# Patient Record
Sex: Female | Born: 1955 | Race: White | Hispanic: No | Marital: Married | Smoking: Never smoker
Health system: Southern US, Community
[De-identification: ages and names within clinical notes are randomized; demographics above are authoritative.]

## PROBLEM LIST (undated history)

## (undated) DIAGNOSIS — E7800 Pure hypercholesterolemia, unspecified: Secondary | ICD-10-CM

## (undated) DIAGNOSIS — E559 Vitamin D deficiency, unspecified: Secondary | ICD-10-CM

## (undated) DIAGNOSIS — E785 Hyperlipidemia, unspecified: Secondary | ICD-10-CM

## (undated) DIAGNOSIS — I10 Essential (primary) hypertension: Secondary | ICD-10-CM

## (undated) DIAGNOSIS — G4700 Insomnia, unspecified: Secondary | ICD-10-CM

## (undated) DIAGNOSIS — E119 Type 2 diabetes mellitus without complications: Secondary | ICD-10-CM

## (undated) DIAGNOSIS — Z87891 Personal history of nicotine dependence: Secondary | ICD-10-CM

## (undated) HISTORY — DX: Pure hypercholesterolemia, unspecified: E78.00

## (undated) HISTORY — DX: Insomnia, unspecified: G47.00

## (undated) HISTORY — DX: Vitamin D deficiency, unspecified: E55.9

## (undated) HISTORY — DX: Personal history of nicotine dependence: Z87.891

## (undated) SURGERY — Surgical Case
Anesthesia: *Unknown

---

## 2016-12-05 ENCOUNTER — Emergency Department (HOSPITAL_COMMUNITY): Payer: Worker's Compensation | Admitting: Anesthesiology

## 2016-12-05 ENCOUNTER — Inpatient Hospital Stay (HOSPITAL_COMMUNITY)
Admission: EM | Admit: 2016-12-05 | Discharge: 2016-12-07 | DRG: 482 | Disposition: A | Discharge status: Home Health | Payer: Worker's Compensation | Attending: Orthopaedic Surgery | Admitting: Orthopaedic Surgery

## 2016-12-05 ENCOUNTER — Encounter (HOSPITAL_COMMUNITY): Payer: Self-pay | Admitting: Emergency Medicine

## 2016-12-05 ENCOUNTER — Emergency Department (HOSPITAL_COMMUNITY): Payer: Worker's Compensation

## 2016-12-05 ENCOUNTER — Encounter (HOSPITAL_COMMUNITY)
Admission: EM | Disposition: A | Discharge status: Home Health | Payer: Self-pay | Source: Home / Self Care | Attending: Orthopaedic Surgery

## 2016-12-05 ENCOUNTER — Inpatient Hospital Stay (HOSPITAL_COMMUNITY): Payer: Worker's Compensation

## 2016-12-05 DIAGNOSIS — I10 Essential (primary) hypertension: Secondary | ICD-10-CM | POA: Diagnosis present

## 2016-12-05 DIAGNOSIS — E119 Type 2 diabetes mellitus without complications: Secondary | ICD-10-CM | POA: Diagnosis present

## 2016-12-05 DIAGNOSIS — S72141A Displaced intertrochanteric fracture of right femur, initial encounter for closed fracture: Principal | ICD-10-CM

## 2016-12-05 DIAGNOSIS — S72001A Fracture of unspecified part of neck of right femur, initial encounter for closed fracture: Secondary | ICD-10-CM | POA: Diagnosis not present

## 2016-12-05 DIAGNOSIS — T148XXA Other injury of unspecified body region, initial encounter: Secondary | ICD-10-CM

## 2016-12-05 DIAGNOSIS — E785 Hyperlipidemia, unspecified: Secondary | ICD-10-CM | POA: Diagnosis present

## 2016-12-05 DIAGNOSIS — W010XXA Fall on same level from slipping, tripping and stumbling without subsequent striking against object, initial encounter: Secondary | ICD-10-CM | POA: Diagnosis present

## 2016-12-05 DIAGNOSIS — M25551 Pain in right hip: Secondary | ICD-10-CM | POA: Diagnosis present

## 2016-12-05 HISTORY — DX: Essential (primary) hypertension: I10

## 2016-12-05 HISTORY — DX: Type 2 diabetes mellitus without complications: E11.9

## 2016-12-05 HISTORY — DX: Hyperlipidemia, unspecified: E78.5

## 2016-12-05 HISTORY — PX: INTRAMEDULLARY (IM) NAIL INTERTROCHANTERIC: SHX5875

## 2016-12-05 LAB — BASIC METABOLIC PANEL
ANION GAP: 12 (ref 5–15)
BUN: 14 mg/dL (ref 6–20)
CO2: 19 mmol/L — AB (ref 22–32)
Calcium: 8.6 mg/dL — ABNORMAL LOW (ref 8.9–10.3)
Chloride: 104 mmol/L (ref 101–111)
Creatinine, Ser: 0.83 mg/dL (ref 0.44–1.00)
GFR calc Af Amer: >60 mL/min (ref >60)
GFR calc non Af Amer: >60 mL/min (ref >60)
Glucose, Bld: 210 mg/dL — ABNORMAL HIGH (ref 65–99)
POTASSIUM: 3.9 mmol/L (ref 3.5–5.1)
Sodium: 135 mmol/L (ref 135–145)

## 2016-12-05 LAB — CBC
HCT: 36.5 % (ref 36.0–46.0)
Hemoglobin: 12.7 g/dL (ref 12.0–15.0)
MCH: 32.3 pg (ref 26.0–34.0)
MCHC: 34.8 g/dL (ref 30.0–36.0)
MCV: 92.9 fL (ref 78.0–100.0)
PLATELETS: 164 10*3/uL (ref 150–400)
RBC: 3.93 MIL/uL (ref 3.87–5.11)
RDW: 12.3 % (ref 11.5–15.5)
WBC: 9 10*3/uL (ref 4.0–10.5)

## 2016-12-05 LAB — GLUCOSE, CAPILLARY
GLUCOSE-CAPILLARY: 154 mg/dL — AB (ref 65–99)
Glucose-Capillary: 179 mg/dL — ABNORMAL HIGH (ref 65–99)

## 2016-12-05 LAB — CBC WITH DIFFERENTIAL/PLATELET
BASOS PCT: 0 %
Basophils Absolute: 0 10*3/uL (ref 0.0–0.1)
Eosinophils Absolute: 0 10*3/uL (ref 0.0–0.7)
Eosinophils Relative: 0 %
HEMATOCRIT: 41.6 % (ref 36.0–46.0)
Hemoglobin: 14.8 g/dL (ref 12.0–15.0)
LYMPHS ABS: 0.9 10*3/uL (ref 0.7–4.0)
LYMPHS PCT: 8 %
MCH: 33.5 pg (ref 26.0–34.0)
MCHC: 35.6 g/dL (ref 30.0–36.0)
MCV: 94.1 fL (ref 78.0–100.0)
MONOS PCT: 4 %
Monocytes Absolute: 0.5 10*3/uL (ref 0.1–1.0)
NEUTROS ABS: 10.3 10*3/uL — AB (ref 1.7–7.7)
NEUTROS PCT: 88 %
Platelets: 142 10*3/uL — ABNORMAL LOW (ref 150–400)
RBC: 4.42 MIL/uL (ref 3.87–5.11)
RDW: 12.4 % (ref 11.5–15.5)
WBC: 11.8 10*3/uL — ABNORMAL HIGH (ref 4.0–10.5)

## 2016-12-05 SURGERY — FIXATION, FRACTURE, INTERTROCHANTERIC, WITH INTRAMEDULLARY ROD
Anesthesia: General | Laterality: Right

## 2016-12-05 MED ORDER — METHOCARBAMOL 500 MG PO TABS
ORAL_TABLET | ORAL | Status: AC
  Filled 2016-12-05: qty 1

## 2016-12-05 MED ORDER — ACETAMINOPHEN 650 MG RE SUPP: 650.0000 mg | Freq: Four times a day (QID) | RECTAL | Status: DC | PRN

## 2016-12-05 MED ORDER — FENTANYL CITRATE (PF) 100 MCG/2ML IJ SOLN
INTRAMUSCULAR | Status: DC | PRN
  Administered 2016-12-05 (×2): 50 ug via INTRAVENOUS

## 2016-12-05 MED ORDER — METOCLOPRAMIDE HCL 5 MG PO TABS: 5.0000 mg | ORAL_TABLET | Freq: Three times a day (TID) | ORAL | Status: DC | PRN

## 2016-12-05 MED ORDER — BUPIVACAINE IN DEXTROSE 0.75-8.25 % IT SOLN
INTRATHECAL | Status: DC | PRN
  Administered 2016-12-05: 2 mL via INTRATHECAL

## 2016-12-05 MED ORDER — MIDAZOLAM HCL 5 MG/5ML IJ SOLN
INTRAMUSCULAR | Status: DC | PRN
  Administered 2016-12-05: 2 mg via INTRAVENOUS

## 2016-12-05 MED ORDER — HYDROMORPHONE HCL 1 MG/ML IJ SOLN: 0.2500 mg | INTRAMUSCULAR | Status: DC | PRN

## 2016-12-05 MED ORDER — POTASSIUM CHLORIDE IN NACL 20-0.45 MEQ/L-% IV SOLN: INTRAVENOUS | Status: DC

## 2016-12-05 MED ORDER — METHOCARBAMOL 1000 MG/10ML IJ SOLN: 500.0000 mg | Freq: Four times a day (QID) | INTRAVENOUS | Status: DC | PRN

## 2016-12-05 MED ORDER — PROPOFOL 500 MG/50ML IV EMUL
INTRAVENOUS | Status: DC | PRN
  Administered 2016-12-05: 50 ug/kg/min via INTRAVENOUS
  Administered 2016-12-05: 75 ug/kg/min via INTRAVENOUS

## 2016-12-05 MED ORDER — CEFAZOLIN IN D5W 1 GM/50ML IV SOLN
1.0000 g | Freq: Four times a day (QID) | INTRAVENOUS | Status: AC
  Administered 2016-12-05 – 2016-12-06 (×3): 1 g via INTRAVENOUS
  Filled 2016-12-05 (×3): qty 50

## 2016-12-05 MED ORDER — CHLORHEXIDINE GLUCONATE 4 % EX LIQD: 60.0000 mL | Freq: Once | CUTANEOUS | Status: DC

## 2016-12-05 MED ORDER — LACTATED RINGERS IV SOLN
INTRAVENOUS | Status: DC
  Administered 2016-12-05 (×2): via INTRAVENOUS

## 2016-12-05 MED ORDER — SODIUM CHLORIDE 0.9 % IV BOLUS (SEPSIS)
500.0000 mL | Freq: Once | INTRAVENOUS | Status: AC
  Administered 2016-12-05: 500 mL via INTRAVENOUS

## 2016-12-05 MED ORDER — PHENYLEPHRINE HCL 10 MG/ML IJ SOLN
INTRAMUSCULAR | Status: DC | PRN
  Administered 2016-12-05 (×5): 80 ug via INTRAVENOUS
  Administered 2016-12-05 (×2): 120 ug via INTRAVENOUS
  Administered 2016-12-05 (×2): 80 ug via INTRAVENOUS
  Administered 2016-12-05: 40 ug via INTRAVENOUS

## 2016-12-05 MED ORDER — METHOCARBAMOL 500 MG PO TABS
500.0000 mg | ORAL_TABLET | Freq: Four times a day (QID) | ORAL | Status: DC | PRN
  Administered 2016-12-05 – 2016-12-07 (×2): 500 mg via ORAL
  Filled 2016-12-05: qty 1

## 2016-12-05 MED ORDER — PROPOFOL 10 MG/ML IV BOLUS
INTRAVENOUS | Status: DC | PRN
  Administered 2016-12-05: 20 mg via INTRAVENOUS
  Administered 2016-12-05: 10 mg via INTRAVENOUS
  Administered 2016-12-05: 20 mg via INTRAVENOUS

## 2016-12-05 MED ORDER — HYDROMORPHONE HCL 2 MG/ML IJ SOLN: 1.0000 mg | INTRAMUSCULAR | Status: DC | PRN

## 2016-12-05 MED ORDER — SODIUM CHLORIDE 0.9 % IV SOLN
INTRAVENOUS | Status: DC
  Administered 2016-12-06: 06:00:00 via INTRAVENOUS

## 2016-12-05 MED ORDER — VITAMIN D 1000 UNITS PO TABS
5000.0000 [IU] | ORAL_TABLET | Freq: Every day | ORAL | Status: DC
  Administered 2016-12-06 – 2016-12-07 (×2): 5000 [IU] via ORAL
  Filled 2016-12-05 (×2): qty 5

## 2016-12-05 MED ORDER — HYDROMORPHONE HCL 1 MG/ML IJ SOLN
1.0000 mg | INTRAMUSCULAR | Status: DC | PRN
Start: 2016-12-05 — End: 2016-12-05

## 2016-12-05 MED ORDER — LOSARTAN POTASSIUM 25 MG PO TABS
25.0000 mg | ORAL_TABLET | Freq: Every day | ORAL | Status: DC
  Administered 2016-12-06 – 2016-12-07 (×2): 25 mg via ORAL
  Filled 2016-12-05 (×2): qty 1

## 2016-12-05 MED ORDER — DOCUSATE SODIUM 100 MG PO CAPS
100.0000 mg | ORAL_CAPSULE | Freq: Two times a day (BID) | ORAL | Status: DC
  Administered 2016-12-05 – 2016-12-07 (×4): 100 mg via ORAL
  Filled 2016-12-05 (×4): qty 1

## 2016-12-05 MED ORDER — ACETAMINOPHEN 325 MG PO TABS: 650.0000 mg | ORAL_TABLET | Freq: Four times a day (QID) | ORAL | Status: DC | PRN

## 2016-12-05 MED ORDER — OXYCODONE HCL 5 MG PO TABS
ORAL_TABLET | ORAL | Status: AC
  Administered 2016-12-05: 10 mg via ORAL
  Filled 2016-12-05: qty 2

## 2016-12-05 MED ORDER — ONDANSETRON HCL 4 MG PO TABS: 4.0000 mg | ORAL_TABLET | Freq: Four times a day (QID) | ORAL | Status: DC | PRN

## 2016-12-05 MED ORDER — HYDROCODONE-ACETAMINOPHEN 10-325 MG PO TABS
0.5000 | ORAL_TABLET | ORAL | Status: DC | PRN
Start: 2016-12-05 — End: 2016-12-07
  Administered 2016-12-05 – 2016-12-07 (×3): 2 via ORAL
  Filled 2016-12-05 (×3): qty 2

## 2016-12-05 MED ORDER — DIPHENHYDRAMINE HCL 12.5 MG/5ML PO ELIX: 12.5000 mg | ORAL_SOLUTION | ORAL | Status: DC | PRN

## 2016-12-05 MED ORDER — METOCLOPRAMIDE HCL 5 MG/ML IJ SOLN
5.0000 mg | Freq: Three times a day (TID) | INTRAMUSCULAR | Status: DC | PRN
Start: 2016-12-05 — End: 2016-12-07

## 2016-12-05 MED ORDER — LIDOCAINE HCL (CARDIAC) 20 MG/ML IV SOLN
INTRAVENOUS | Status: DC | PRN
  Administered 2016-12-05: 40 mg via INTRAVENOUS

## 2016-12-05 MED ORDER — PHENYLEPHRINE 40 MCG/ML (10ML) SYRINGE FOR IV PUSH (FOR BLOOD PRESSURE SUPPORT)
PREFILLED_SYRINGE | INTRAVENOUS | Status: AC
  Filled 2016-12-05: qty 20

## 2016-12-05 MED ORDER — METFORMIN HCL ER 500 MG PO TB24
1000.0000 mg | ORAL_TABLET | Freq: Two times a day (BID) | ORAL | Status: DC
  Administered 2016-12-06 – 2016-12-07 (×3): 1000 mg via ORAL
  Filled 2016-12-05 (×3): qty 2

## 2016-12-05 MED ORDER — HYDROMORPHONE HCL 1 MG/ML IJ SOLN
INTRAMUSCULAR | Status: AC
Start: 2016-12-05 — End: 2016-12-05
  Administered 2016-12-05: 0.5 mg via INTRAVENOUS
  Filled 2016-12-05: qty 0.5

## 2016-12-05 MED ORDER — PROMETHAZINE HCL 25 MG/ML IJ SOLN: 6.2500 mg | INTRAMUSCULAR | Status: DC | PRN

## 2016-12-05 MED ORDER — CEFAZOLIN SODIUM-DEXTROSE 2-4 GM/100ML-% IV SOLN
INTRAVENOUS | Status: AC
  Filled 2016-12-05: qty 100

## 2016-12-05 MED ORDER — OXYCODONE HCL 5 MG PO TABS
5.0000 mg | ORAL_TABLET | ORAL | Status: DC | PRN
  Administered 2016-12-05 – 2016-12-06 (×3): 10 mg via ORAL
  Administered 2016-12-07: 5 mg via ORAL
  Administered 2016-12-07: 10 mg via ORAL
  Filled 2016-12-05: qty 2
  Filled 2016-12-05: qty 1
  Filled 2016-12-05 (×2): qty 2

## 2016-12-05 MED ORDER — HYDROMORPHONE HCL 1 MG/ML IJ SOLN
0.2500 mg | INTRAMUSCULAR | Status: DC | PRN
  Administered 2016-12-05: 0.5 mg via INTRAVENOUS

## 2016-12-05 MED ORDER — ONDANSETRON HCL 4 MG/2ML IJ SOLN
4.0000 mg | Freq: Once | INTRAMUSCULAR | Status: AC
  Administered 2016-12-05: 4 mg via INTRAVENOUS
  Filled 2016-12-05: qty 2

## 2016-12-05 MED ORDER — ZOLPIDEM TARTRATE 5 MG PO TABS
5.0000 mg | ORAL_TABLET | Freq: Every evening | ORAL | Status: DC | PRN
  Administered 2016-12-06: 5 mg via ORAL
  Filled 2016-12-05: qty 1

## 2016-12-05 MED ORDER — ASPIRIN 325 MG PO TABS
325.0000 mg | ORAL_TABLET | Freq: Every day | ORAL | Status: DC
  Administered 2016-12-06 – 2016-12-07 (×2): 325 mg via ORAL
  Filled 2016-12-05 (×2): qty 1

## 2016-12-05 MED ORDER — LIDOCAINE 2% (20 MG/ML) 5 ML SYRINGE
INTRAMUSCULAR | Status: AC
  Filled 2016-12-05: qty 5

## 2016-12-05 MED ORDER — MIDAZOLAM HCL 2 MG/2ML IJ SOLN
INTRAMUSCULAR | Status: AC
  Filled 2016-12-05: qty 2

## 2016-12-05 MED ORDER — DEXTROSE 5 % IV SOLN: 500.0000 mg | Freq: Four times a day (QID) | INTRAVENOUS | Status: DC | PRN

## 2016-12-05 MED ORDER — PROPOFOL 10 MG/ML IV BOLUS
INTRAVENOUS | Status: AC
  Filled 2016-12-05: qty 20

## 2016-12-05 MED ORDER — HYDROMORPHONE HCL 1 MG/ML IJ SOLN
1.0000 mg | Freq: Once | INTRAMUSCULAR | Status: AC
  Administered 2016-12-05: 1 mg via INTRAVENOUS
  Filled 2016-12-05: qty 1

## 2016-12-05 MED ORDER — ENOXAPARIN SODIUM 40 MG/0.4ML ~~LOC~~ SOLN
40.0000 mg | Freq: Every day | SUBCUTANEOUS | Status: DC
  Administered 2016-12-06: 40 mg via SUBCUTANEOUS
  Filled 2016-12-05: qty 0.4

## 2016-12-05 MED ORDER — ONDANSETRON HCL 4 MG/2ML IJ SOLN: 4.0000 mg | Freq: Four times a day (QID) | INTRAMUSCULAR | Status: DC | PRN

## 2016-12-05 MED ORDER — METHOCARBAMOL 500 MG PO TABS: 500.0000 mg | ORAL_TABLET | Freq: Four times a day (QID) | ORAL | Status: DC | PRN

## 2016-12-05 MED ORDER — CEFAZOLIN SODIUM-DEXTROSE 2-4 GM/100ML-% IV SOLN
2.0000 g | INTRAVENOUS | Status: AC
  Administered 2016-12-05: 2 g via INTRAVENOUS

## 2016-12-05 MED ORDER — HYDROMORPHONE HCL 2 MG/ML IJ SOLN
1.0000 mg | INTRAMUSCULAR | Status: DC | PRN
  Administered 2016-12-06 (×6): 1 mg via INTRAVENOUS
  Filled 2016-12-05 (×6): qty 1

## 2016-12-05 MED ORDER — 0.9 % SODIUM CHLORIDE (POUR BTL) OPTIME
TOPICAL | Status: DC | PRN
  Administered 2016-12-05: 1000 mL

## 2016-12-05 MED ORDER — DOCUSATE SODIUM 100 MG PO CAPS: 100.0000 mg | ORAL_CAPSULE | Freq: Two times a day (BID) | ORAL | Status: DC

## 2016-12-05 MED ORDER — HYDROCHLOROTHIAZIDE 25 MG PO TABS
25.0000 mg | ORAL_TABLET | Freq: Every day | ORAL | Status: DC
  Administered 2016-12-07: 25 mg via ORAL
  Filled 2016-12-05 (×2): qty 1

## 2016-12-05 MED ORDER — FENTANYL CITRATE (PF) 100 MCG/2ML IJ SOLN
INTRAMUSCULAR | Status: AC
  Filled 2016-12-05: qty 2

## 2016-12-05 MED ORDER — PRAVASTATIN SODIUM 20 MG PO TABS
20.0000 mg | ORAL_TABLET | Freq: Every day | ORAL | Status: DC
Start: 2016-12-05 — End: 2016-12-07
  Administered 2016-12-05 – 2016-12-06 (×2): 20 mg via ORAL
  Filled 2016-12-05 (×2): qty 1

## 2016-12-05 SURGICAL SUPPLY — 46 items
BIT DRILL CANN LRG QC 17.0X300 (BIT) ×3 IMPLANT
BLADE SURG 15 STRL LF DISP TIS (BLADE) ×1 IMPLANT
BLADE SURG 15 STRL SS (BLADE) ×2
BNDG GAUZE ELAST 4 BULKY (GAUZE/BANDAGES/DRESSINGS) ×3 IMPLANT
CATH FOLEY 2WAY SLVR 30CC 16FR (CATHETERS) ×3 IMPLANT
COVER PERINEAL POST (MISCELLANEOUS) ×3 IMPLANT
COVER SURGICAL LIGHT HANDLE (MISCELLANEOUS) ×3 IMPLANT
DRAPE STERI IOBAN 125X83 (DRAPES) ×3 IMPLANT
DRSG AQUACEL AG ADV 3.5X10 (GAUZE/BANDAGES/DRESSINGS) ×3 IMPLANT
DRSG MEPILEX BORDER 4X4 (GAUZE/BANDAGES/DRESSINGS) IMPLANT
DRSG MEPILEX BORDER 4X8 (GAUZE/BANDAGES/DRESSINGS) ×3 IMPLANT
DRSG PAD ABDOMINAL 8X10 ST (GAUZE/BANDAGES/DRESSINGS) ×6 IMPLANT
DURAPREP 26ML APPLICATOR (WOUND CARE) ×3 IMPLANT
ELECT REM PT RETURN 9FT ADLT (ELECTROSURGICAL) ×3
ELECTRODE REM PT RTRN 9FT ADLT (ELECTROSURGICAL) ×1 IMPLANT
FACESHIELD WRAPAROUND (MASK) ×3 IMPLANT
GAUZE XEROFORM 1X8 LF (GAUZE/BANDAGES/DRESSINGS) ×3 IMPLANT
GAUZE XEROFORM 5X9 LF (GAUZE/BANDAGES/DRESSINGS) ×3 IMPLANT
GLOVE BIO SURGEON STRL SZ8 (GLOVE) ×3 IMPLANT
GLOVE BIOGEL PI IND STRL 8 (GLOVE) ×1 IMPLANT
GLOVE BIOGEL PI INDICATOR 8 (GLOVE) ×2
GLOVE ORTHO TXT STRL SZ7.5 (GLOVE) ×3 IMPLANT
GOWN STRL REUS W/ TWL LRG LVL3 (GOWN DISPOSABLE) ×1 IMPLANT
GOWN STRL REUS W/ TWL XL LVL3 (GOWN DISPOSABLE) ×2 IMPLANT
GOWN STRL REUS W/TWL LRG LVL3 (GOWN DISPOSABLE) ×2
GOWN STRL REUS W/TWL XL LVL3 (GOWN DISPOSABLE) ×4
GUIDEWIRE 3.2X400 (WIRE) ×3 IMPLANT
KIT BASIN OR (CUSTOM PROCEDURE TRAY) ×3 IMPLANT
KIT ROOM TURNOVER OR (KITS) ×3 IMPLANT
LINER BOOT UNIVERSAL DISP (MISCELLANEOUS) ×3 IMPLANT
MANIFOLD NEPTUNE II (INSTRUMENTS) ×3 IMPLANT
NAIL 9MM/130 TI CANN TFNA 340 (Nail) ×3 IMPLANT
NS IRRIG 1000ML POUR BTL (IV SOLUTION) ×3 IMPLANT
PACK GENERAL/GYN (CUSTOM PROCEDURE TRAY) ×3 IMPLANT
PAD ARMBOARD 7.5X6 YLW CONV (MISCELLANEOUS) ×3 IMPLANT
PAD CAST 4YDX4 CTTN HI CHSV (CAST SUPPLIES) ×2 IMPLANT
PADDING CAST COTTON 4X4 STRL (CAST SUPPLIES) ×4
SCREW TFNA 90MM HIP (Orthopedic Implant) ×3 IMPLANT
STAPLER VISISTAT 35W (STAPLE) ×3 IMPLANT
SUT VIC AB 0 CT1 27 (SUTURE) ×4
SUT VIC AB 0 CT1 27XBRD ANBCTR (SUTURE) ×2 IMPLANT
SUT VIC AB 2-0 CT1 27 (SUTURE) ×4
SUT VIC AB 2-0 CT1 TAPERPNT 27 (SUTURE) ×2 IMPLANT
TOWEL OR 17X24 6PK STRL BLUE (TOWEL DISPOSABLE) ×3 IMPLANT
TOWEL OR 17X26 10 PK STRL BLUE (TOWEL DISPOSABLE) ×3 IMPLANT
WATER STERILE IRR 1000ML POUR (IV SOLUTION) IMPLANT

## 2016-12-05 NOTE — Anesthesia Procedure Notes (Signed)
Spinal  Patient location during procedure: OR Start time: 12/05/2016 4:53 PM End time: 12/05/2016 4:56 PM Staffing Anesthesiologist: Cecile HearingURK, Tenley Winward EDWARD Performed: anesthesiologist  Preanesthetic Checklist Completed: patient identified, surgical consent, pre-op evaluation, timeout performed, IV checked, risks and benefits discussed and monitors and equipment checked Spinal Block Patient position: right lateral decubitus Prep: site prepped and draped and DuraPrep Patient monitoring: continuous pulse ox and blood pressure Approach: midline Location: L3-4 Needle Needle type: Pencan  Needle gauge: 24 G Needle length: 12.7 cm Assessment Sensory level: T10 Additional Notes Functioning IV was confirmed and monitors were applied. Sterile prep and drape, including hand hygiene, mask and sterile gloves were used. The patient was positioned and the spine was prepped. The skin was anesthetized with lidocaine.  Free flow of clear CSF was obtained prior to injecting local anesthetic into the CSF.  The spinal needle aspirated freely following injection.  The needle was carefully withdrawn.  The patient tolerated the procedure well. Consent was obtained prior to procedure with all questions answered and concerns addressed. Risks including but not limited to bleeding, infection, nerve damage, paralysis, failed block, inadequate analgesia, allergic reaction, high spinal, itching and headache were discussed and the patient wished to proceed.   Arrie AranStephen Levern Kalka, MD

## 2016-12-05 NOTE — Addendum Note (Signed)
Addendum  created 12/05/16 2058 by Dorris Singhharlene Hermes Wafer, MD   Order sets accessed

## 2016-12-05 NOTE — Anesthesia Procedure Notes (Signed)
Spinal

## 2016-12-05 NOTE — ED Notes (Signed)
Patient returned from xray.

## 2016-12-05 NOTE — ED Notes (Signed)
Patient laying in prone position for comfort given pain medication prior to moving patient. Patient tolerated moving and removing jeans without incident. Pedal pulse +2 full sensation after.

## 2016-12-05 NOTE — ED Notes (Signed)
Ortho provider at  bedside ?

## 2016-12-05 NOTE — Brief Op Note (Signed)
12/05/2016  6:05 PM  PATIENT:  Carol Lutz  61 y.o. female  PRE-OPERATIVE DIAGNOSIS:  RIGHT HIP FRACTURE  POST-OPERATIVE DIAGNOSIS:  RIGHT HIP FRACTURE  PROCEDURE:  Procedure(s): ORIF RIGHT HIP FACTURE (Right)  SURGEON:  Surgeon(s) and Role:    * Kathryne Hitchhristopher Y Blackman, MD - Primary  PHYSICIAN ASSISTANT: Rexene EdisonGil Clark, PA-C  ANESTHESIA:   spinal  EBL:  Total I/O In: -  Out: 500 [Urine:350; Blood:150]  COUNTS:  YES   DICTATION: .Other Dictation: Dictation Number [id number]  PLAN OF CARE: Admit to inpatient   PATIENT DISPOSITION:  PACU - hemodynamically stable.   Delay start of Pharmacological VTE agent (>24hrs) due to surgical blood loss or risk of bleeding: no

## 2016-12-05 NOTE — Transfer of Care (Signed)
Immediate Anesthesia Transfer of Care Note  Patient: Carol Lutz  Procedure(s) Performed: Procedure(s): ORIF RIGHT HIP FACTURE (Right)  Patient Location: PACU  Anesthesia Type:Spinal  Level of Consciousness: awake, alert , oriented and patient cooperative  Airway & Oxygen Therapy: Patient Spontanous Breathing and Patient connected to nasal cannula oxygen  Post-op Assessment: Report given to RN and Post -op Vital signs reviewed and stable  Post vital signs: Reviewed and stable  Last Vitals:  Vitals:   12/05/16 1515 12/05/16 1530  BP: 129/77 133/85  Pulse: 93 87  Resp:  16  Temp:      Last Pain:  Vitals:   12/05/16 1255  TempSrc:   PainSc: 2          Complications: No apparent anesthesia complications

## 2016-12-05 NOTE — ED Provider Notes (Signed)
[facility]-EMERGENCY DEPT Provider Note   CSN: [id number] Arrival date & time: 12/05/16  1131     History   Chief Complaint Chief Complaint  Patient presents with  . Fall  . Hip Pain    HPI Carol Lutz is a 61 y.o. female.  Patient was getting something out of her car, heavy pill, when she swung it around and this movement and caused her to fall forward injuring her "pelvis".  She was unable to get up after the fall.  She complains of pain only in her pelvis.  She presents by EMS, prone, for evaluation.  She was treated with fentanyl with slight improvement of her discomfort.  She denies other preceding symptoms or recent illnesses.  There are no other known modifying factors.  HPI  Past Medical History:  Diagnosis Date  . Diabetes mellitus without complication (HCC)   . Hyperlipemia   . Hypertension     There are no active problems to display for this patient.   History reviewed. No pertinent surgical history.  OB History    No data available       Home Medications    Prior to Admission medications   Not on File    Family History No family history on file.  Social History Social History  Substance Use Topics  . Smoking status: Never Smoker  . Smokeless tobacco: Never Used  . Alcohol use No     Allergies   Patient has no allergy information on record.   Review of Systems Review of Systems  All other systems reviewed and are negative.    Physical Exam Updated Vital Signs BP (!) 154/63   Pulse 92   Temp 97.8 F (36.6 C) (Oral)   Resp 16   Ht 5\' 3"  (1.6 m)   Wt 138 lb (62.6 kg)   SpO2 99%   BMI 24.45 kg/m   Physical Exam  Constitutional: She is oriented to person, place, and time. She appears well-developed and well-nourished. She appears distressed (She is very uncomfortable).  HENT:  Head: Normocephalic and atraumatic.  Eyes: Conjunctivae and EOM are normal. Pupils are equal, round, and reactive to light.  Neck: Normal range of motion  and phonation normal. Neck supple.  Cardiovascular: Normal rate and regular rhythm.   Pulmonary/Chest: Effort normal and breath sounds normal. She exhibits no tenderness.  Abdominal: Soft. She exhibits no distension. There is no tenderness. There is no guarding.  Musculoskeletal: Normal range of motion.  Moderate tenderness right posterior pelvis, examined while prone.  No leg length discrepancy.  Severe pain with any isolated movement of the left hip.  Neurological: She is alert and oriented to person, place, and time. She exhibits normal muscle tone.  Skin: Skin is warm and dry.  Psychiatric: She has a normal mood and affect. Her behavior is normal. Judgment and thought content normal.  Nursing note and vitals reviewed.    ED Treatments / Results  Labs (all labs ordered are listed, but only abnormal results are displayed) Labs Reviewed  BASIC METABOLIC PANEL  CBC WITH DIFFERENTIAL/PLATELET    EKG  EKG Interpretation None       Radiology No results found.  Procedures Procedures (including critical care time)  Medications Ordered in ED Medications  HYDROmorphone (DILAUDID) injection 1 mg (not administered)  ondansetron (ZOFRAN) injection 4 mg (not administered)  sodium chloride 0.9 % bolus 500 mL (not administered)     Initial Impression / Assessment and Plan / ED Course  I have  reviewed the triage vital signs and the nursing notes.  Pertinent labs & imaging results that were available during my care of the patient were reviewed by me and considered in my medical decision making (see chart for details).  Clinical Course as of Dec 06 1207  Fri Dec 05, 2016  1208 Initial management will be to medicate the patient, with narcotic analgesia, then move her to a supine position, to allow for appropriate imaging.  [EW]    Clinical Course User Index [EW] Mancel BaleElliott Ranisha Allaire, MD    Medications  HYDROmorphone (DILAUDID) injection 1 mg (1 mg Intravenous Given 12/05/16 1217)    ondansetron (ZOFRAN) injection 4 mg (4 mg Intravenous Given 12/05/16 1211)  sodium chloride 0.9 % bolus 500 mL (500 mLs Intravenous New Bag/Given 12/05/16 1210)  HYDROmorphone (DILAUDID) injection 1 mg (1 mg Intravenous Given 12/05/16 1226)    Patient Vitals for the past 24 hrs:  BP Temp Temp src Pulse Resp SpO2 Height Weight  12/05/16 1257 (!) 160/89 - - 95 18 99 % - -  12/05/16 1200 (!) 163/94 - - 89 18 98 % - -  12/05/16 1148 (!) 154/63 97.8 F (36.6 C) Oral 92 16 99 % 5\' 3"  (1.6 m) 138 lb (62.6 kg)  12/05/16 1144 - - - - - 97 % - -    1:19 PM Reevaluation with update and discussion. After initial assessment and treatment, an updated evaluation reveals she is more comfortable and states she has pain only when moving the hip at this time.  Findings discussed with patient and all questions answered.  She will be maintained n.p.o., for hip surgery. Marin Milley L   13: 20-consult orthopedics.   Final Clinical Impressions(s) / ED Diagnoses   Final diagnoses:  Closed intertrochanteric fracture of hip, right, initial encounter (HCC)   Mechanical fall, with isolated injury right hip fracture.  Patient is healthy, no chronic medical problems not anticoagulated, with mild high blood pressure that may be pain related.  She will require evaluation and treatment by orthopedics, likely with surgical repair.  Nursing Notes Reviewed/ Care Coordinated Applicable Imaging Reviewed Interpretation of Laboratory Data incorporated into ED treatment  Plan: Admit   New Prescriptions New Prescriptions   No medications on file     Mancel BaleElliott Pao Haffey, MD 12/05/16 [phone]

## 2016-12-05 NOTE — Anesthesia Preprocedure Evaluation (Addendum)
Anesthesia Evaluation  Patient identified by MRN, date of birth, ID band Patient awake    Reviewed: Allergy & Precautions, NPO status , Patient's Chart, lab work & pertinent test results  Airway Mallampati: II  TM Distance: >3 FB Neck ROM: Full    Dental no notable dental hx. (+) Teeth Intact, Dental Advisory Given   Pulmonary Current Smoker,    Pulmonary exam normal breath sounds clear to auscultation       Cardiovascular hypertension, Pt. on medications Normal cardiovascular exam Rhythm:Regular Rate:Normal     Neuro/Psych negative neurological ROS  negative psych ROS   GI/Hepatic negative GI ROS, Neg liver ROS,   Endo/Other  diabetes, Type 2, Oral Hypoglycemic AgentsNot treated  Renal/GU negative Renal ROS  negative genitourinary   Musculoskeletal negative musculoskeletal ROS (+)   Abdominal   Peds negative pediatric ROS (+)  Hematology negative hematology ROS (+)   Anesthesia Other Findings   Reproductive/Obstetrics negative OB ROS                           Anesthesia Physical Anesthesia Plan  ASA: II  Anesthesia Plan: Spinal   Post-op Pain Management:    Induction: Intravenous  Airway Management Planned: Simple Face Mask  Additional Equipment:   Intra-op Plan:   Post-operative Plan:   Informed Consent: I have reviewed the patients History and Physical, chart, labs and discussed the procedure including the risks, benefits and alternatives for the proposed anesthesia with the patient or authorized representative who has indicated his/her understanding and acceptance.   Dental advisory given  Plan Discussed with: CRNA and Surgeon  Anesthesia Plan Comments:        Anesthesia Quick Evaluation

## 2016-12-05 NOTE — Anesthesia Postprocedure Evaluation (Signed)
Anesthesia Post Note  Patient: Carol NodalKathy Landfair  Procedure(s) Performed: Procedure(s) (LRB): ORIF RIGHT HIP FACTURE (Right)  Patient location during evaluation: PACU Anesthesia Type: Spinal Level of consciousness: awake Pain management: pain level controlled Vital Signs Assessment: post-procedure vital signs reviewed and stable Respiratory status: spontaneous breathing Cardiovascular status: stable Postop Assessment: spinal receding Anesthetic complications: no       Last Vitals:  Vitals:   12/05/16 1830 12/05/16 1845  BP: 116/75 (!) 118/58  Pulse: 75 79  Resp: 20 (!) 21  Temp:      Last Pain:  Vitals:   12/05/16 1255  TempSrc:   PainSc: 2                  Carol Lutz

## 2016-12-05 NOTE — H&P (Signed)
Carol Lutz is an 61 y.o. female.   Chief Complaint: Right hip fx HPI: Carol Lutz was unloading some buckets of glue from a vehicle (she owns a carpet business) when she lost balance and fell on her left side. She denies loss of consciousness. She was brought to the hospital for evaluation and found to have a right intertrochanteric hip fracture. Orthopedics was consulted.  Past Medical History:  Diagnosis Date  . Diabetes mellitus without complication (HCC)   . Hyperlipemia   . Hypertension     History reviewed. No pertinent surgical history.  No family history on file. Social History:  reports that she has never smoked. She has never used smokeless tobacco. She reports that she does not drink alcohol or use drugs.  Allergies: Not on File   Results for orders placed or performed during the hospital encounter of 12/05/16 (from the past 48 hour(s))  CBC with Differential     Status: Abnormal   Collection Time: 12/05/16  1:27 PM  Result Value Ref Range   WBC 11.8 (H) 4.0 - 10.5 K/uL   RBC 4.42 3.87 - 5.11 MIL/uL   Hemoglobin 14.8 12.0 - 15.0 g/dL   HCT 16.1 09.6 - 04.5 %   MCV 94.1 78.0 - 100.0 fL   MCH 33.5 26.0 - 34.0 pg   MCHC 35.6 30.0 - 36.0 g/dL   RDW 40.9 81.1 - 91.4 %   Platelets 142 (L) 150 - 400 K/uL   Neutrophils Relative % 88 %   Neutro Abs 10.3 (H) 1.7 - 7.7 K/uL   Lymphocytes Relative 8 %   Lymphs Abs 0.9 0.7 - 4.0 K/uL   Monocytes Relative 4 %   Monocytes Absolute 0.5 0.1 - 1.0 K/uL   Eosinophils Relative 0 %   Eosinophils Absolute 0.0 0.0 - 0.7 K/uL   Basophils Relative 0 %   Basophils Absolute 0.0 0.0 - 0.1 K/uL   Dg Hip Unilat  With Pelvis 2-3 Views Right  Result Date: 12/05/2016 CLINICAL DATA:  Fall, acute right hip pain EXAM: DG HIP (WITH OR WITHOUT PELVIS) 2-3V RIGHT COMPARISON:  None available FINDINGS: There is an acute minimally displaced right hip intertrochanteric fracture. No associated right hip subluxation or dislocation. Bony pelvis intact. Minor  degenerative change of the lumbar sacral spine and SI joints. Left hip appears grossly intact. Cross-table lateral views are limited because of positioning. IMPRESSION: Acute minimally displaced right hip intertrochanteric fracture. Electronically Signed   By: Judie Petit.  Shick M.D.   On: 12/05/2016 12:56    Review of Systems  Constitutional: Negative for weight loss.  HENT: Negative for ear discharge, ear pain, hearing loss and tinnitus.   Eyes: Negative for blurred vision, double vision, photophobia and pain.  Respiratory: Negative for cough, sputum production and shortness of breath.   Cardiovascular: Negative for chest pain.  Gastrointestinal: Negative for abdominal pain, nausea and vomiting.  Genitourinary: Negative for dysuria, flank pain, frequency and urgency.  Musculoskeletal: Positive for joint pain (Right hip). Negative for back pain, falls, myalgias and neck pain.  Neurological: Negative for dizziness, tingling, sensory change, focal weakness, loss of consciousness and headaches.  Endo/Heme/Allergies: Does not bruise/bleed easily.  Psychiatric/Behavioral: Negative for depression, memory loss and substance abuse. The patient is not nervous/anxious.     Blood pressure (!) 158/92, pulse 89, temperature 97.8 F (36.6 C), temperature source Oral, resp. rate 16, height 5\' 3"  (1.6 m), weight 62.6 kg (138 lb), SpO2 98 %. Physical Exam  Vitals reviewed. Constitutional: She is oriented  to person, place, and time. She appears well-developed and well-nourished. She is cooperative. No distress. Nasal cannula in place.  HENT:  Head: Normocephalic and atraumatic. Head is without raccoon's eyes, without Battle's sign, without abrasion, without contusion and without laceration.  Right Ear: Hearing, tympanic membrane, external ear and ear canal normal. No lacerations. No drainage or tenderness. No foreign bodies. Tympanic membrane is not perforated. No hemotympanum.  Left Ear: Hearing, tympanic membrane,  external ear and ear canal normal. No lacerations. No drainage or tenderness. No foreign bodies. Tympanic membrane is not perforated. No hemotympanum.  Nose: Nose normal. No nose lacerations, sinus tenderness, nasal deformity or nasal septal hematoma. No epistaxis.  Mouth/Throat: Uvula is midline, oropharynx is clear and moist and mucous membranes are normal. No lacerations.  Eyes: Conjunctivae, EOM and lids are normal. Pupils are equal, round, and reactive to light. Right eye exhibits no discharge. Left eye exhibits no discharge. No scleral icterus.  Neck: Trachea normal and normal range of motion. Neck supple. No JVD present. No spinous process tenderness and no muscular tenderness present. Carotid bruit is not present. No tracheal deviation present. No thyromegaly present.  Cardiovascular: Normal rate, regular rhythm, normal heart sounds, intact distal pulses and normal pulses.  Exam reveals no gallop and no friction rub.   No murmur heard. Respiratory: Effort normal and breath sounds normal. No respiratory distress. She has no wheezes. She has no rales. She exhibits no tenderness, no bony tenderness, no laceration and no crepitus.  GI: Soft. Normal appearance. She exhibits no distension. Bowel sounds are decreased. There is no tenderness. There is no rigidity, no rebound, no guarding and no CVA tenderness.  Musculoskeletal:  UEx shoulder, elbow, wrist, digits- no skin wounds, nontender, no instability, no blocks to motion  Sens  Ax/R/M/U intact  Mot   Ax/ R/ PIN/ M/ AIN/ U intact  Rad 2+  Pelvis--no traumatic wounds or rash, no ecchymosis, stable to manual stress, nontender  LEX No traumatic wounds, ecchymosis, or rash  Right hip TTP  No effusions  Knee stable to varus/ valgus and anterior/posterior stress  Sens DPN, SPN, TN intact  Motor EHL, ext, flex, evers 5/5 (EHL 4/5 on left)  DP 2+, PT 2+, No significant edema  Lymphadenopathy:    She has no cervical adenopathy.  Neurological:  She is alert and oriented to person, place, and time. She has normal strength. No cranial nerve deficit or sensory deficit. GCS eye subscore is 4. GCS verbal subscore is 5. GCS motor subscore is 6.  Skin: Skin is warm, dry and intact. She is not diaphoretic.  Psychiatric: She has a normal mood and affect. Her speech is normal and behavior is normal.     Assessment/Plan Fall Right intertrochanteric hip fx -- For fixation this evening by Dr. Magnus IvanBlackman. PT/OT    Freeman CaldronMichael J. Sivan Quast, PA-C Orthopedic Surgery [phone] 12/05/2016, 2:00 PM

## 2016-12-05 NOTE — ED Triage Notes (Signed)
Arrived by EMS. Onset today pulling buckets of glue out of car swung a little to fast fell to ground on right hip. Denies LOC EMS administered Fentanyl 200 mcg. IVP Pain at rest 2/10 sore and 10/10 sharp with movement. Full sensation all extremities pedal pulses +2.

## 2016-12-06 LAB — BASIC METABOLIC PANEL
Anion gap: 10 (ref 5–15)
BUN: 11 mg/dL (ref 6–20)
CALCIUM: 8.6 mg/dL — AB (ref 8.9–10.3)
CHLORIDE: 102 mmol/L (ref 101–111)
CO2: 24 mmol/L (ref 22–32)
CREATININE: 0.85 mg/dL (ref 0.44–1.00)
Glucose, Bld: 222 mg/dL — ABNORMAL HIGH (ref 65–99)
Potassium: 3.9 mmol/L (ref 3.5–5.1)
SODIUM: 136 mmol/L (ref 135–145)

## 2016-12-06 NOTE — Progress Notes (Signed)
Subjective: 1 Day Post-Op Procedure(s) (LRB): ORIF RIGHT HIP FACTURE (Right) Patient reports pain as moderate.    Objective: Vital signs in last 24 hours: Temp:  [97.2 F (36.2 C)-98.7 F (37.1 C)] 98.4 F (36.9 C) (03/24 0838) Pulse Rate:  [71-95] 82 (03/24 0838) Resp:  [12-21] 18 (03/24 0838) BP: (105-163)/(53-94) 128/59 (03/24 0838) SpO2:  [91 %-99 %] 95 % (03/24 0838) Weight:  [138 lb (62.6 kg)] 138 lb (62.6 kg) (03/23 1148)  Intake/Output from previous day: 03/23 0701 - 03/24 0700 In: 1120 [P.O.:120; I.V.:1000] Out: 1875 [Urine:1725; Blood:150] Intake/Output this shift: No intake/output data recorded.   Recent Labs  12/05/16 1327 12/05/16 2143  HGB 14.8 12.7    Recent Labs  12/05/16 1327 12/05/16 2143  WBC 11.8* 9.0  RBC 4.42 3.93  HCT 41.6 36.5  PLT 142* 164    Recent Labs  12/05/16 1327 12/06/16 0545  NA 135 136  K 3.9 3.9  CL 104 102  CO2 19* 24  BUN 14 11  CREATININE 0.83 0.85  GLUCOSE 210* 222*  CALCIUM 8.6* 8.6*   No results for input(s): LABPT, INR in the last 72 hours.  Sensation intact distally Intact pulses distally Dorsiflexion/Plantar flexion intact Incision: scant drainage  Assessment/Plan: 1 Day Post-Op Procedure(s) (LRB): ORIF RIGHT HIP FACTURE (Right) Up with therapy - attempt full weight bearing as tolerated on right hip. Discharge to home once clears therapy and pain controlled.  Kathryne HitchChristopher Y Saben Donigan 12/06/2016, 8:44 AM

## 2016-12-06 NOTE — Evaluation (Signed)
Physical Therapy Evaluation Patient Details Name: Carol NodalKathy Grosser MRN: [id number] DOB: 14-Jan-1956 Today's Date: 12/06/2016   History of Present Illness  Pt is a 61 yo female admitted through ED on 12/05/16 following a fall resulting in a right femur fx. Pt underwent an ORIF on 12/05/16. PMH significant for HTN, HLD, DM.    Clinical Impression  Pt is POD 1 following the above procedure. Prior to admission, pt was completely independent and working full time for a [employer]. Pt requires Min to Mod A for majority of mobility this session including short distance gait in the room. Pt is noticeably lethargic toward the end of the session which I suspect is due to medications. Pt is normally anxious and has difficulty focusing on one subject at a time per herself and her husband. Pt will benefit from continued acute PT services in order to address the below deficits including stair negotiation prior to discharge home. Pt will require HHPT in order to maximize her outcomes once she returns home.     Follow Up Recommendations Home health PT;Supervision for mobility/OOB    Equipment Recommendations  Rolling walker with 5" wheels;3in1 (PT)    Recommendations for Other Services       Precautions / Restrictions Precautions Precautions: Fall Restrictions Weight Bearing Restrictions: Yes RUE Weight Bearing: Weight bearing as tolerated RLE Weight Bearing: Weight bearing as tolerated      Mobility  Bed Mobility Overal bed mobility: Needs Assistance Bed Mobility: Supine to Sit     Supine to sit: Mod assist     General bed mobility comments: Mod A to bring LE's EOB and to stabilize trunk at EOB  Transfers Overall transfer level: Needs assistance Equipment used: Rolling walker (2 wheeled) Transfers: Sit to/from Stand Sit to Stand: Mod assist         General transfer comment: Mod A to boost up from EOB and to stabilize on RW  Ambulation/Gait Ambulation/Gait assistance: Min  assist Ambulation Distance (Feet): 40 Feet (30x1, 10x1) Assistive device: Rolling walker (2 wheeled) Gait Pattern/deviations: Step-to pattern;Decreased step length - right;Decreased step length - left;Antalgic Gait velocity: decreased Gait velocity interpretation: Below normal speed for age/gender General Gait Details: Moderate antalgic gait with decreased step length bilaterally. cues for sequencing and staying within walker during gait. Cues given for attempting to weight bear through RLE  Stairs            Wheelchair Mobility    Modified Rankin (Stroke Patients Only)       Balance Overall balance assessment: Needs assistance Sitting-balance support: No upper extremity supported;Feet supported Sitting balance-Leahy Scale: Good     Standing balance support: Bilateral upper extremity supported;During functional activity Standing balance-Leahy Scale: Poor Standing balance comment: Relies on RW for stabilty in standing                             Pertinent Vitals/Pain Pain Assessment: 0-10 Pain Score: 6  Pain Location: right hip with mobility Pain Descriptors / Indicators: Aching;Grimacing;Guarding;Sharp Pain Intervention(s): Monitored during session;Premedicated before session;Ice applied;Limited activity within patient's tolerance    Home Living Family/patient expects to be discharged to:: Private residence Living Arrangements: Spouse/significant other Available Help at Discharge: Family;Available 24 hours/day Type of Home: House Home Access: Stairs to enter Entrance Stairs-Rails: None Entrance Stairs-Number of Steps: 2 Home Layout: Two level;Bed/bath upstairs;1/2 bath on main level Home Equipment: None      Prior Function Level of Independence: Independent  Comments: working full time and independent with everything     Hand Dominance   Dominant Hand: Right    Extremity/Trunk Assessment   Upper Extremity Assessment Upper  Extremity Assessment: Defer to OT evaluation    Lower Extremity Assessment Lower Extremity Assessment: RLE deficits/detail RLE Deficits / Details: pt presents with weakness and ROM deficits as is expected following surgery. At least 3/5 ankle and 2/5 knee and hip per gross functional assessment.  RLE: Unable to fully assess due to pain       Communication   Communication: No difficulties  Cognition Arousal/Alertness: Awake/alert Behavior During Therapy: WFL for tasks assessed/performed;Anxious Overall Cognitive Status: Within Functional Limits for tasks assessed                                 General Comments: pt is nervous about moving for first time      General Comments General comments (skin integrity, edema, etc.): Husband and daugther present throughout session and are helpful as needed    Exercises     Assessment/Plan    PT Assessment Patient needs continued PT services  PT Problem List Decreased strength;Decreased range of motion;Decreased activity tolerance;Decreased balance;Decreased mobility;Decreased knowledge of use of DME;Pain       PT Treatment Interventions DME instruction;Gait training;Stair training;Functional mobility training;Therapeutic activities;Therapeutic exercise;Balance training    PT Goals (Current goals can be found in the Care Plan section)  Acute Rehab PT Goals Patient Stated Goal: to get back to work PT Goal Formulation: With patient Time For Goal Achievement: 12/13/16 Potential to Achieve Goals: Good    Frequency Min 5X/week   Barriers to discharge        Co-evaluation               End of Session Equipment Utilized During Treatment: Gait belt Activity Tolerance: Patient limited by pain Patient left: in chair;with call bell/phone within reach;with family/visitor present Nurse Communication: Mobility status PT Visit Diagnosis: Difficulty in walking, not elsewhere classified (R26.2);Pain Pain - Right/Left:  Right Pain - part of body: Hip    Time: 1211-1251 PT Time Calculation (min) (ACUTE ONLY): 40 min   Charges:   PT Evaluation $PT Eval Moderate Complexity: 1 Procedure PT Treatments $Gait Training: 8-22 mins $Therapeutic Activity: 8-22 mins   PT G Codes:        Colin Broach PT, DPT  [phone]   Roxy Manns 12/06/2016, 2:04 PM

## 2016-12-06 NOTE — Discharge Instructions (Signed)
You may increase your activities as comfort allows. You can put weight on our right hip as comfort allows. You can get your right hip dressing wet in the shower daily. Leave your current dressing on for at least one week, then you can get your actual incision wet daily.

## 2016-12-07 LAB — HIV ANTIBODY (ROUTINE TESTING W REFLEX): HIV SCREEN 4TH GENERATION: NONREACTIVE

## 2016-12-07 MED ORDER — ASPIRIN 325 MG PO TABS: 325.0000 mg | ORAL_TABLET | Freq: Every day | ORAL | 0 refills | Status: DC

## 2016-12-07 MED ORDER — OXYCODONE-ACETAMINOPHEN 5-325 MG PO TABS: 1.0000 | ORAL_TABLET | ORAL | 0 refills | Status: DC | PRN

## 2016-12-07 MED ORDER — METHOCARBAMOL 500 MG PO TABS: 500.0000 mg | ORAL_TABLET | Freq: Four times a day (QID) | ORAL | 0 refills | Status: DC | PRN

## 2016-12-07 NOTE — Progress Notes (Signed)
Subjective: 2 Days Post-Op Procedure(s) (LRB): ORIF RIGHT HIP FACTURE (Right) Patient reports pain as moderate.    Objective: Vital signs in last 24 hours: Temp:  [98.4 F (36.9 C)-99.1 F (37.3 C)] 99 F (37.2 C) (03/25 0537) Pulse Rate:  [78-88] 86 (03/25 0537) Resp:  [16] 16 (03/24 1630) BP: (127-151)/(59-68) 127/68 (03/25 0537) SpO2:  [92 %-96 %] 94 % (03/25 0537)  Intake/Output from previous day: 03/24 0701 - 03/25 0700 In: 480 [P.O.:480] Out: -  Intake/Output this shift: No intake/output data recorded.   Recent Labs  12/05/16 1327 12/05/16 2143  HGB 14.8 12.7    Recent Labs  12/05/16 1327 12/05/16 2143  WBC 11.8* 9.0  RBC 4.42 3.93  HCT 41.6 36.5  PLT 142* 164    Recent Labs  12/05/16 1327 12/06/16 0545  NA 135 136  K 3.9 3.9  CL 104 102  CO2 19* 24  BUN 14 11  CREATININE 0.83 0.85  GLUCOSE 210* 222*  CALCIUM 8.6* 8.6*   No results for input(s): LABPT, INR in the last 72 hours.  Intact pulses distally Dorsiflexion/Plantar flexion intact Incision: scant drainage No cellulitis present Compartment soft  Assessment/Plan: 2 Days Post-Op Procedure(s) (LRB): ORIF RIGHT HIP FACTURE (Right) Up with therapy Discharge home with home health today.  Kathryne HitchChristopher Y Blackman 12/07/2016, 8:55 AM

## 2016-12-07 NOTE — Progress Notes (Signed)
qPhysical Therapy Treatment Patient Details Name: Carol Lutz MRN: [id number] DOB: [birth year] Today's Date: 12/07/2016    History of Present Illness Pt is a 61 yo female admitted through ED on 12/05/16 following a fall resulting in a right femur fx. Pt underwent an ORIF on 12/05/16. PMH significant for HTN, HLD, DM.      PT Comments    Patient is progressing toward mobility goals. Husband present and actively participated with transfers and stair negotiation. Continue to progress as tolerated with anticipated d/c home with HHPT.    Follow Up Recommendations  Home health PT;Supervision for mobility/OOB     Equipment Recommendations  Rolling walker with 5" wheels;3in1 (PT)    Recommendations for Other Services       Precautions / Restrictions Precautions Precautions: Fall Restrictions Weight Bearing Restrictions: Yes RUE Weight Bearing: Weight bearing as tolerated RLE Weight Bearing: Weight bearing as tolerated    Mobility  Bed Mobility Overal bed mobility: Needs Assistance Bed Mobility: Sit to Supine     Supine to sit: Mod assist Sit to supine: Mod assist   General bed mobility comments: assist to bring R LE into bed; cues for sequencing and technique  Transfers Overall transfer level: Needs assistance Equipment used: Rolling walker (2 wheeled) Transfers: Sit to/from Stand Sit to Stand: Min guard;Min assist         General transfer comment: min A first trial from recliner and min guard next 2 trials from recliner with cues for safe hand placement and technique  Ambulation/Gait Ambulation/Gait assistance: Min guard Ambulation Distance (Feet): 100 Feet Assistive device: Rolling walker (2 wheeled) Gait Pattern/deviations: Step-to pattern;Step-through pattern;Decreased stance time - right;Decreased step length - left;Decreased weight shift to right;Antalgic Gait velocity: decreased   General Gait Details: cues for sequencing, R knee flexion during swing phase to  decrease vaulting gait, and proximity of RW to aid in use of bilat UE support; pt with improved ability for R hip/knee flexion with increased gait distance   Stairs Stairs: Yes   Stair Management: No rails;Step to pattern;Backwards;With walker Number of Stairs: 2 General stair comments: cues for sequencing and technique; min A to stabilize RW; husband assisted  Wheelchair Mobility    Modified Rankin (Stroke Patients Only)       Balance Overall balance assessment: Needs assistance Sitting-balance support: No upper extremity supported;Feet supported Sitting balance-Leahy Scale: Good Sitting balance - Comments: Min guard throughout due to pain with flexed hip position   Standing balance support: Bilateral upper extremity supported;During functional activity Standing balance-Leahy Scale: Poor Standing balance comment: Relies on RW for stabilty in standing                            Cognition Arousal/Alertness: Awake/alert Behavior During Therapy: WFL for tasks assessed/performed;Anxious Overall Cognitive Status: Within Functional Limits for tasks assessed                                        Exercises      General Comments General comments (skin integrity, edema, etc.): HEP hanout given and reviewed      Pertinent Vitals/Pain Pain Assessment: Faces Faces Pain Scale: Hurts even more Pain Location: right hip/groin area Pain Descriptors / Indicators: Aching;Grimacing;Guarding;Sharp Pain Intervention(s): Limited activity within patient's tolerance;Monitored during session;Premedicated before session;Repositioned;Ice applied    Home Living Family/patient expects to be discharged to:: Private residence  Living Arrangements: Spouse/significant other Available Help at Discharge: Family;Available 24 hours/day Type of Home: House     Home Layout: Two level;Able to live on main level with bedroom/bathroom Home Equipment: None Additional Comments:  Planning to stay at daughters house upon d/c; above inforamation reflects daughters home    Prior Function Level of Independence: Independent      Comments: working full time and independent with everything   PT Goals (current goals can now be found in the care plan section) Acute Rehab PT Goals Patient Stated Goal: to get back to work PT Goal Formulation: With patient Time For Goal Achievement: 12/13/16 Potential to Achieve Goals: Good Progress towards PT goals: Progressing toward goals    Frequency    Min 5X/week      PT Plan Current plan remains appropriate    Co-evaluation             End of Session Equipment Utilized During Treatment: Gait belt Activity Tolerance: Patient tolerated treatment well Patient left: with call bell/phone within reach;in bed Nurse Communication: Mobility status PT Visit Diagnosis: Difficulty in walking, not elsewhere classified (R26.2);Pain Pain - Right/Left: Right Pain - part of body: Hip     Time: [id number] PT Time Calculation (min) (ACUTE ONLY): 43 min  Charges:  $Gait Training: 23-37 mins $Therapeutic Activity: 8-22 mins                    G Codes:       Erline LevineKellyn Coralie Stanke, PTA Pager: [phone]     Carolynne EdouardKellyn R Eydie Wormley 12/07/2016, 11:35 AM

## 2016-12-07 NOTE — Evaluation (Signed)
Occupational Therapy Evaluation Patient Details Name: Carol Lutz MRN: [id number] DOB: 1955/10/24 Today's Date: 12/07/2016    History of Present Illness Pt is a 61 yo female admitted through ED on 12/05/16 following a fall resulting in a right femur fx. Pt underwent an ORIF on 12/05/16. PMH significant for HTN, HLD, DM.     Clinical Impression   Pt reports she was independent with ADL PTA. Currently pt overall min assist for functional mobility, min guard for seated UB ADL, and mod-max assist for LB ADL. Pt planning to d/c home with 24/7 supervision from family. Recommending HHOT for follow up to maximize independence and safety with ADL and functional mobility upon return home. Pt would benefit from continued skilled OT to address established goals.    Follow Up Recommendations  Home health OT;Supervision/Assistance - 24 hour    Equipment Recommendations  3 in 1 bedside commode    Recommendations for Other Services       Precautions / Restrictions Precautions Precautions: Fall Restrictions Weight Bearing Restrictions: Yes RLE Weight Bearing: Weight bearing as tolerated      Mobility Bed Mobility Overal bed mobility: Needs Assistance Bed Mobility: Supine to Sit     Supine to sit: Mod assist     General bed mobility comments: Assist for RLE to EOB and for trunk elevation. Increased time required throughout  Transfers Overall transfer level: Needs assistance Equipment used: Rolling walker (2 wheeled) Transfers: Sit to/from Stand Sit to Stand: Min assist         General transfer comment: Min assist to boost up from EOB/BSC. Cues for hand placement and safety    Balance Overall balance assessment: Needs assistance Sitting-balance support: Feet supported;No upper extremity supported Sitting balance-Leahy Scale: Fair Sitting balance - Comments: Min guard throughout due to pain with flexed hip position   Standing balance support: No upper extremity supported;During  functional activity Standing balance-Leahy Scale: Fair Standing balance comment: Able to stand at sink for grooming activities without UE support                           ADL either performed or assessed with clinical judgement   ADL Overall ADL's : Needs assistance/impaired Eating/Feeding: Set up;Sitting   Grooming: Min guard;Standing;Brushing hair;Wash/dry hands;Oral care   Upper Body Bathing: Min guard;Sitting   Lower Body Bathing: Moderate assistance;Sit to/from stand   Upper Body Dressing : Min guard;Sitting   Lower Body Dressing: Maximal assistance;Sit to/from stand Lower Body Dressing Details (indicate cue type and reason): Educated on compensatory strategies for LB ADL Toilet Transfer: Minimal assistance;Ambulation;BSC;RW Toilet Transfer Details (indicate cue type and reason): Educated on use of 3 in 1 over toilet Toileting- Clothing Manipulation and Hygiene: Minimal assistance;Sit to/from stand Toileting - Clothing Manipulation Details (indicate cue type and reason): for clothing and balance   Tub/Shower Transfer Details (indicate cue type and reason): Educated on sponge bathing until [facility] therapist can educate on tub transfer with 3 in 1 Functional mobility during ADLs: Minimal assistance;Rolling walker General ADL Comments: Educated on use of sheet for leg lifter for increased independence with bed mobility. Encouraged frequent mobility throghout the day. Educated on ice for [employer]     Praxis      Pertinent Vitals/Pain Pain Assessment: Faces Faces Pain Scale: Hurts even more Pain Location: R hip/groin with mobility Pain Descriptors / Indicators: Aching;Sharp Pain Intervention(s): Monitored during  session;Premedicated before session;Repositioned;Ice applied     Hand Dominance Right   Extremity/Trunk Assessment Upper Extremity Assessment Upper Extremity Assessment: Overall WFL for tasks assessed    Lower Extremity Assessment Lower Extremity Assessment: Defer to PT evaluation   Cervical / Trunk Assessment Cervical / Trunk Assessment: Normal   Communication Communication Communication: No difficulties   Cognition Arousal/Alertness: Awake/alert Behavior During Therapy: WFL for tasks assessed/performed;Anxious Overall Cognitive Status: Within Functional Limits for tasks assessed                                     General Comments       Exercises     Shoulder Instructions      Home Living Family/patient expects to be discharged to:: Private residence Living Arrangements: Spouse/significant other Available Help at Discharge: Family;Available 24 hours/day Type of Home: House       Home Layout: Two level;Able to live on main level with bedroom/bathroom     Bathroom Shower/Tub: Chief Strategy OfficerTub/shower unit   Bathroom Toilet: Standard     Home Equipment: None   Additional Comments: Planning to stay at daughters house upon d/c; above inforamation reflects daughters home      Prior Functioning/Environment Level of Independence: Independent        Comments: working full time and independent with everything        OT Problem List: Decreased strength;Decreased range of motion;Impaired balance (sitting and/or standing);Decreased activity tolerance;Decreased knowledge of use of DME or AE;Decreased knowledge of precautions;Pain      OT Treatment/Interventions: Self-care/ADL training;Energy conservation;DME and/or AE instruction;Therapeutic activities;Patient/family education;Balance training    OT Goals(Current goals can be found in the care plan section) Acute Rehab OT Goals Patient Stated Goal: to get back to work OT Goal Formulation: With patient/family Time For Goal Achievement: 12/21/16 Potential to Achieve Goals: Good ADL Goals Pt Will Perform Lower Body Bathing: with supervision;sit to/from stand (with or without AE) Pt Will Perform Lower Body  Dressing: with supervision;sit to/from stand (with or without AE) Pt Will Transfer to Toilet: with supervision;ambulating;bedside commode (over toilet) Pt Will Perform Tub/Shower Transfer: Tub transfer;with supervision;ambulating;3 in 1;rolling walker  OT Frequency: Min 2X/week   Barriers to D/C:            Co-evaluation              End of Session Equipment Utilized During Treatment: Gait belt;Rolling walker  Activity Tolerance: Patient tolerated treatment well Patient left: in chair;with call bell/phone within reach;with family/visitor present  OT Visit Diagnosis: Other abnormalities of gait and mobility (R26.89);Pain Pain - Right/Left: Right Pain - part of body: Hip                Time: [id number] OT Time Calculation (min): 31 min Charges:  OT General Charges $OT Visit: 1 Procedure OT Evaluation $OT Eval Moderate Complexity: 1 Procedure OT Treatments $Self Care/Home Management : 8-22 mins G-Codes:     Fredric MareBailey A. Brett Albinooffey, M.S., OTR/L Pager: [phone]  Gaye AlkenBailey A Shawnika Pepin 12/07/2016, 10:26 AM

## 2016-12-07 NOTE — Care Management Note (Signed)
Case Management Note  Patient Details  Name: Francee NodalKathy Getman MRN: [id number] Date of Birth: Dec 14, 1955  Subjective/Objective: ORIF hip Action/Plan: Discharge planning Expected Discharge Date:  12/07/16               Expected Discharge Plan:  Home w Home Health Services  In-House Referral:     Discharge planning Services  CM Consult  Post Acute Care Choice:  Home Health Choice offered to:  Patient  DME Arranged:  3-N-1, Walker rolling DME Agency:  [facility]  HH Arranged:  PT HH Agency[facility]  [facility]  Status of Service:  Completed, signed off  If discussed at [employer] Meetings, dates discussed:    Additional Comments: CM spoke with pt to offer choice of home health agency. Pt chooses [facility] to render HHPT. Referral called to [facility] rep, [place].  CM notified [facility] DME rep, Reggie to please deliver the rolling walker and 3n1 to room so pt can discharge. No other CM needs were communicated. Yves DillJeffries, Johnathyn Viscomi Christine, RN 12/07/2016, 12:21 PM

## 2016-12-07 NOTE — Discharge Summary (Signed)
Patient ID: Carol Lutz MRN: [id number] DOB/AGE: [birth year] 61 y.o.  Admit date: 12/05/2016 Discharge date: 12/07/2016  Admission Diagnoses:  Active Problems:   Closed right hip fracture (HCC)   Closed intertrochanteric fracture of hip, right, initial encounter (HCC)   Closed comminuted intertrochanteric fracture of right femur [facility])   Discharge Diagnoses:  Same  Past Medical History:  Diagnosis Date  . Diabetes mellitus without complication (HCC)   . Hyperlipemia   . Hypertension     Surgeries: Procedure(s): ORIF RIGHT HIP FACTURE on 12/05/2016   Consultants: Treatment Team:  Kathryne Hitch, MD  Discharged Condition: Improved  Hospital Course: Carol Lutz is an 61 y.o. female who was admitted 12/05/2016 for operative treatment of<principal problem not specified>. Patient has severe unremitting pain that affects sleep, daily activities, and work/hobbies. After pre-op clearance the patient was taken to the operating room on 12/05/2016 and underwent  Procedure(s): ORIF RIGHT HIP FACTURE.    Patient was given perioperative antibiotics: Anti-infectives    Start     Dose/Rate Route Frequency Ordered Stop   12/06/16 0600  ceFAZolin (ANCEF) IVPB 2g/100 mL premix     2 g 200 mL/hr over 30 Minutes Intravenous On call to O.R. 12/05/16 1553 12/05/16 1642   12/05/16 2200  ceFAZolin (ANCEF) IVPB 1 g/50 mL premix     1 g 100 mL/hr over 30 Minutes Intravenous Every 6 hours 12/05/16 2134 12/06/16 0904   12/05/16 1601  ceFAZolin (ANCEF) 2-4 GM/100ML-% IVPB    Comments:  Lorenda Ishihara   : cabinet override      12/05/16 1601 12/05/16 1642       Patient was given sequential compression devices, early ambulation, and chemoprophylaxis to prevent DVT.  Patient benefited maximally from hospital stay and there were no complications.    Recent vital signs: Patient Vitals for the past 24 hrs:  BP Temp Temp src Pulse Resp SpO2  12/07/16 0537 127/68 99 F (37.2 C) Oral 86 - 94 %   12/06/16 1937 (!) 151/59 99.1 F (37.3 C) Oral 88 - 92 %  12/06/16 1630 (!) 144/66 98.4 F (36.9 C) Oral 78 16 96 %     Recent laboratory studies:  Recent Labs  12/05/16 1327 12/05/16 2143 12/06/16 0545  WBC 11.8* 9.0  --   HGB 14.8 12.7  --   HCT 41.6 36.5  --   PLT 142* 164  --   NA 135  --  136  K 3.9  --  3.9  CL 104  --  102  CO2 19*  --  24  BUN 14  --  11  CREATININE 0.83  --  0.85  GLUCOSE 210*  --  222*  CALCIUM 8.6*  --  8.6*     Discharge Medications:   Allergies as of 12/07/2016   No Known Allergies     Medication List    TAKE these medications   aspirin 325 MG tablet Take 1 tablet (325 mg total) by mouth daily.   cholecalciferol 1000 units tablet Commonly known as:  VITAMIN D Take 5,000 Units by mouth daily.   hydrochlorothiazide 25 MG tablet Commonly known as:  HYDRODIURIL Take 25 mg by mouth daily.   ibuprofen 200 MG tablet Commonly known as:  ADVIL,MOTRIN Take 400 mg by mouth every 6 (six) hours as needed for mild pain.   losartan 25 MG tablet Commonly known as:  COZAAR Take 25 mg by mouth daily.   metFORMIN 500 MG 24 hr tablet Commonly known  as:  GLUCOPHAGE-XR Take 1,000 mg by mouth 2 (two) times daily.   methocarbamol 500 MG tablet Commonly known as:  ROBAXIN Take 1 tablet (500 mg total) by mouth every 6 (six) hours as needed for muscle spasms.   oxyCODONE-acetaminophen 5-325 MG tablet Commonly known as:  ROXICET Take 1-2 tablets by mouth every 4 (four) hours as needed.   pravastatin 20 MG tablet Commonly known as:  PRAVACHOL Take 20 mg by mouth daily.   zolpidem 10 MG tablet Commonly known as:  AMBIEN Take 10 mg by mouth at bedtime as needed for sleep.            Durable Medical Equipment        Start     Ordered   12/05/16 2135  DME Walker rolling  Once    Question:  Patient needs a walker to treat with the following condition  Answer:  Hip fracture [facility])   12/05/16 2134   12/05/16 2135  DME 3 n 1  Once      12/05/16 2134      Diagnostic Studies: Dg C-arm 1-60 Min  Result Date: 12/05/2016 CLINICAL DATA:  ORIF right hip EXAM: DG C-ARM 61-120 MIN; RIGHT FEMUR 2 VIEWS COMPARISON:  12/05/2016 FINDINGS: Total fluoroscopy time was 78.1 seconds. Six low resolution intraoperative spot views of the right hip are submitted. The images demonstrate intramedullary rod fixation of the right femur across an intertrochanteric fracture. IMPRESSION: Intraoperative fluoroscopy provided during surgical fixation of right femoral fracture Electronically Signed   By: Jasmine Pang M.D.   On: 12/05/2016 18:17   Dg Hip Unilat  With Pelvis 2-3 Views Right  Result Date: 12/05/2016 CLINICAL DATA:  Fall, acute right hip pain EXAM: DG HIP (WITH OR WITHOUT PELVIS) 2-3V RIGHT COMPARISON:  None available FINDINGS: There is an acute minimally displaced right hip intertrochanteric fracture. No associated right hip subluxation or dislocation. Bony pelvis intact. Minor degenerative change of the lumbar sacral spine and SI joints. Left hip appears grossly intact. Cross-table lateral views are limited because of positioning. IMPRESSION: Acute minimally displaced right hip intertrochanteric fracture. Electronically Signed   By: Judie Petit.  Shick M.D.   On: 12/05/2016 12:56   Dg Femur, Min 2 Views Right  Result Date: 12/05/2016 CLINICAL DATA:  ORIF right hip EXAM: DG C-ARM 61-120 MIN; RIGHT FEMUR 2 VIEWS COMPARISON:  12/05/2016 FINDINGS: Total fluoroscopy time was 78.1 seconds. Six low resolution intraoperative spot views of the right hip are submitted. The images demonstrate intramedullary rod fixation of the right femur across an intertrochanteric fracture. IMPRESSION: Intraoperative fluoroscopy provided during surgical fixation of right femoral fracture Electronically Signed   By: Jasmine Pang M.D.   On: 12/05/2016 18:17    Disposition:  To home  Discharge Instructions    Discharge patient    Complete by:  As directed    Discharge  disposition:  01-Home or Self Care   Discharge patient date:  12/07/2016   Face-to-face encounter (required for Medicare/Medicaid patients)    Complete by:  As directed    I Kathryne Hitch certify that this patient is under my care and that I, or a nurse practitioner or physician's assistant working with me, had a face-to-face encounter that meets the physician face-to-face encounter requirements with this patient on 12/07/2016. The encounter with the patient was in whole, or in part for the following medical condition(s) which is the primary reason for home health care (List medical condition): right hip fracture   The encounter with  the patient was in whole, or in part, for the following medical condition, which is the primary reason for home health care:  mobility, right hip fracture   I certify that, based on my findings, the following services are medically necessary home health services:  Physical therapy   Reason for Medically Necessary Home Health Services:  Therapy- Instruction on use of Assistive Device for Ambulation on all Surfaces   My clinical findings support the need for the above services:  Unable to leave home safely without assistance and/or assistive device   Further, I certify that my clinical findings support that this patient is homebound due to:  Ambulates short distances less than 300 feet   Home Health    Complete by:  As directed    To provide the following care/treatments:  PT   Walker standard    Complete by:  As directed       Follow-up Information    Kathryne Hitchhristopher Y Gracelyn Coventry, MD. Schedule an appointment as soon as possible for a visit in 2 week(s).   Specialty:  Orthopedic Surgery Contact information: [address] [place] [address] [phone]            Signed: Kathryne HitchChristopher Y Indra Wolters 12/07/2016, 8:59 AM

## 2016-12-08 ENCOUNTER — Encounter (HOSPITAL_COMMUNITY): Payer: Self-pay | Admitting: Orthopaedic Surgery

## 2016-12-08 NOTE — Op Note (Signed)
NAME:  Carol Lutz, Carol Lutz                    ACCOUNT NO.:  MEDICAL RECORD NO.:  [id number]  LOCATION:                                 FACILITY:  PHYSICIAN:  Vanita PandaChristopher Y. Magnus IvanBlackman, M.D.DATE OF BIRTH:  DATE OF PROCEDURE:  12/05/2016 DATE OF DISCHARGE:                              OPERATIVE REPORT   PREOPERATIVE DIAGNOSIS:  Displaced right hip intertrochanteric femur fracture.  POSTOPERATIVE DIAGNOSIS:  Displaced right hip intertrochanteric femur fracture.  PROCEDURE:  Open reduction and internal fixation of right hip intertrochanteric fracture utilizing intramedullary nail and hip screw construct.  IMPLANTS:  Synthes TFN 9 x 340 femoral nail with a 90 mm lag screw.  SURGEON:  Vanita PandaChristopher Y. Magnus IvanBlackman, M.D.  ASSISTANT:  Richardean CanalGilbert Clark, PA-C.  ANESTHESIA:  Spinal.  ANTIBIOTICS:  2 g of IV Ancef.  BLOOD LOSS:  Less than 200 mL.  COMPLICATIONS:  None.  INDICATIONS:  Carol Lutz is a 61 year old female, who sustained an accidental mechanical fall when she slipped real hard landing on her right hip directly today.  She was seen in the emergency room after the inability to ambulate and found to have a displaced intertrochanteric right hip proximal femur fracture.  It was recommended she undergo an open reduction and fixation of this fracture.  She is a healthy 61 year old needs to be admitted to the orthopedic service due to the fact that this is not an elderly hip fracture.  We had a thorough discussion of risks and benefits of surgery.  I talked to her and her husband about this and they do understand the need to proceed with surgery.  PROCEDURE DESCRIPTION:  After informed consent was obtained, appropriate right hip was marked.  She was brought to the operating room where spinal anesthesia was obtained while she was on her stretcher.  A Foley catheter was placed and then she was placed supine on the fracture table with the right operative leg in an in-line skeletal traction  with a perineal post in place and the left hip abducted and flexed out of the field with appropriate padding of the popliteal area.  The operative hip was then adducted and traction was performed with internal rotation.  We assessed the fracture under direct fluoroscopy and were pleased with the fracture reduction.  We then chose our nail under direct fluoroscopic guidance keeping it sterile in the box choosing a 9 x 340 Synthes femoral nail.  This was passed off sterilely to the back table.  Her right hip was then prepped and draped with DuraPrep and sterile drapes. A time-out was called and she was identified as correct patient and correct right hip.  We then made an incision just proximal to the greater trochanter and dissected down the tip of greater trochanter.  A guide pin was then inserted temporary from the tip of the greater trochanter to the level of lesser trochanter.  This was done under direct fluoroscopy.  We then used an initiating reamer to open up the femoral canal.  We removed the guide pin.  We were able to then easily pass the femoral nail without needing to ream.  Using the outrigger guide, we made a  separate lateral incision of the thigh.  We were able to then place a temporary guide pin from the lateral cortex of the femur traversing the intertrochanteric fracture into a good position in the femoral head.  We took a measurement of this and then we drilled for a size 90 lag screw.  We then put a lag screw in without difficulty.  We locked it from the top and lessened traction off the bed, so we could compress the fracture.  We then removed all instrumentation.  We put the hip through internal and external rotation and it moved as a unit under direct fluoroscopy.  We were pleased with fracture reduction.  We then irrigated 2 small wounds with normal saline solution.  We closed the deep tissue with 0 Vicryl followed by 2-0 Vicryl in the subcutaneous tissue, interrupted  staples on the skin.  Well-padded sterile dressing was applied.  She was taken off the fracture table and taken to the recovery room in stable condition.  All final counts were correct. There were no complications noted.  Of note, Richardean Canal, PA-C, assisted in entire case.  His assistance was crucial for facilitating all aspects of this case.     Vanita Panda. Magnus Ivan, M.D.     CYB/MEDQ  D:  12/05/2016  T:  12/05/2016  Job:  [id number]

## 2016-12-09 ENCOUNTER — Telehealth (INDEPENDENT_AMBULATORY_CARE_PROVIDER_SITE_OTHER): Payer: Self-pay | Admitting: Orthopaedic Surgery

## 2016-12-09 NOTE — Telephone Encounter (Signed)
Verbal order given  

## 2016-12-09 NOTE — Telephone Encounter (Signed)
Phys therapist calling to verify PT orders for pt 3x week for 2 weeks  [phone]

## 2016-12-19 ENCOUNTER — Ambulatory Visit (INDEPENDENT_AMBULATORY_CARE_PROVIDER_SITE_OTHER): Payer: Worker's Compensation | Admitting: Physician Assistant

## 2016-12-19 ENCOUNTER — Ambulatory Visit (INDEPENDENT_AMBULATORY_CARE_PROVIDER_SITE_OTHER): Payer: Worker's Compensation

## 2016-12-19 DIAGNOSIS — S72141P Displaced intertrochanteric fracture of right femur, subsequent encounter for closed fracture with malunion: Secondary | ICD-10-CM

## 2016-12-19 DIAGNOSIS — M25551 Pain in right hip: Secondary | ICD-10-CM

## 2016-12-19 MED ORDER — METHOCARBAMOL 500 MG PO TABS: 500.0000 mg | ORAL_TABLET | Freq: Four times a day (QID) | ORAL | 0 refills | Status: DC | PRN

## 2016-12-19 MED ORDER — OXYCODONE-ACETAMINOPHEN 5-325 MG PO TABS: 1.0000 | ORAL_TABLET | ORAL | 0 refills | Status: DC | PRN

## 2016-12-19 NOTE — Progress Notes (Signed)
Office Visit Note   Patient: Carol Lutz           Date of Birth: Jun 15, 1956           MRN: [id number] Visit Date: 12/19/2016              Requested by: Angelica Chessman, MD [address] [place], [place] [address] PCP: Angelica Chessman., MD   Assessment & Plan: Visit Diagnoses:  1. Pain in right hip   2. Closed comminuted intertrochanteric fracture of right femur with malunion, subsequent encounter     Plan: She'll work on her own for range of motion strengthening of the right hip. If she changes her mind and would like to have home physical therapy work with her we can always arrange this. Scar tissue mobilization encouraged. She is able to shower and get the incision wet. Weightbearing as tolerated right hip  Follow-Up Instructions: Return in about 4 weeks (around 01/16/2017).   Orders:  Orders Placed This Encounter  Procedures  . XR HIP UNILAT W OR W/O PELVIS 1V RIGHT   Meds ordered this encounter  Medications  . oxyCODONE-acetaminophen (ROXICET) 5-325 MG tablet    Sig: Take 1-2 tablets by mouth every 4 (four) hours as needed.    Dispense:  40 tablet    Refill:  0  . methocarbamol (ROBAXIN) 500 MG tablet    Sig: Take 1 tablet (500 mg total) by mouth every 6 (six) hours as needed for muscle spasms.    Dispense:  60 tablet    Refill:  0      Procedures: No procedures performed   Clinical Data: No additional findings.   Subjective: Status post right hip open reduction internal fixation  HPI Carol Lutz working days status post IM nailing of right hip fracture. Overall doing well. She is ambulating with a cane sometimes even walking with a walker for long distances. She stopped her asthma about 3 days ago Saturday chest pain shortness breath fevers chills or calf pain. Review of Systems See HPI  Objective: Vital Signs: There were no vitals taken for this visit.  Physical Exam Oriented 3. No acute distress Ortho Exam Right hip surgical  incision is healing well no signs of infection right calf supple nontender. Dorsal splint flexion ankle intact. Good Range of motion of the right hip without pain Specialty Comments:  No specialty comments available.  Imaging: Xr Hip Unilat W Or W/o Pelvis 1v Right  Result Date: 12/19/2016 AP pelvis lateral view of the right hip: Status post IM nailing intertrochanteric hip fracture with the near anatomic alignment of the intertrochanteric fracture. Flesh trochanter remains DISPLACED. No other bony abnormalities no acute fractures otherwise.    PMFS History: Patient Active Problem List   Diagnosis Date Noted  . Closed right hip fracture (HCC) 12/05/2016  . Closed comminuted intertrochanteric fracture of right femur (HCC) 12/05/2016  . Closed intertrochanteric fracture of hip, right, initial encounter [facility])    Past Medical History:  Diagnosis Date  . Diabetes mellitus without complication (HCC)   . Hyperlipemia   . Hypertension     No family history on file.  Past Surgical History:  Procedure Laterality Date  . INTRAMEDULLARY (IM) NAIL INTERTROCHANTERIC Right 12/05/2016   Procedure: ORIF RIGHT HIP FACTURE;  Surgeon: Kathryne Hitch, MD;  Location: [facility] OR;  Service: Orthopedics;  Laterality: Right;   Social History   Occupational History  . Not on file.   Social History Main Topics  .  Smoking status: Never Smoker  . Smokeless tobacco: Never Used  . Alcohol use No  . Drug use: No  . Sexual activity: Not on file

## 2016-12-25 ENCOUNTER — Encounter (INDEPENDENT_AMBULATORY_CARE_PROVIDER_SITE_OTHER): Payer: Self-pay | Admitting: Physician Assistant

## 2016-12-25 ENCOUNTER — Ambulatory Visit (INDEPENDENT_AMBULATORY_CARE_PROVIDER_SITE_OTHER): Payer: Worker's Compensation | Admitting: Physician Assistant

## 2016-12-25 ENCOUNTER — Other Ambulatory Visit (INDEPENDENT_AMBULATORY_CARE_PROVIDER_SITE_OTHER): Payer: Self-pay

## 2016-12-25 DIAGNOSIS — M79604 Pain in right leg: Secondary | ICD-10-CM

## 2016-12-25 DIAGNOSIS — S72141A Displaced intertrochanteric fracture of right femur, initial encounter for closed fracture: Secondary | ICD-10-CM

## 2016-12-25 DIAGNOSIS — M7989 Other specified soft tissue disorders: Secondary | ICD-10-CM

## 2016-12-25 NOTE — Progress Notes (Signed)
Carol Lutz returns today due to right foot swelling. She denies any chest pain shortness breath fevers chills. She notes her swelling becomes worse with elevation. She is now 20 days status post open reduction internal fixation of a right hip fracture.  Physical exam: Right calf is supple she has some tenderness distal to the gastroc and soleus muscle bellies. No tenderness over the Achilles. Mild-to-moderate edema of the right foot. No rashes skin lesions ulcerations or erythema.  Plan we'll send her for a Doppler of her right lower leg to rule out DVT. Otherwise she'll follow up with [place] at this scheduled.

## 2016-12-26 ENCOUNTER — Inpatient Hospital Stay (HOSPITAL_COMMUNITY): Admission: RE | Admit: 2016-12-26 | Payer: BLUE CROSS/BLUE SHIELD | Source: Ambulatory Visit

## 2016-12-26 ENCOUNTER — Encounter (INDEPENDENT_AMBULATORY_CARE_PROVIDER_SITE_OTHER): Payer: Self-pay | Admitting: *Deleted

## 2016-12-26 ENCOUNTER — Ambulatory Visit (HOSPITAL_COMMUNITY)
Admission: RE | Admit: 2016-12-26 | Discharge: 2016-12-26 | Disposition: A | Discharge status: Home / Self Care | Payer: Worker's Compensation | Source: Ambulatory Visit | Attending: Physician Assistant | Admitting: Physician Assistant

## 2016-12-26 DIAGNOSIS — M79604 Pain in right leg: Secondary | ICD-10-CM | POA: Diagnosis present

## 2016-12-26 DIAGNOSIS — M7989 Other specified soft tissue disorders: Secondary | ICD-10-CM | POA: Insufficient documentation

## 2016-12-26 NOTE — Progress Notes (Signed)
**  Preliminary report by tech**  Right lower extremity venous duplex complete. There is no evidence of deep or superficial vein thrombosis involving the right lower extremity. All visualized vessels appear patent and compressible. There is no evidence of a Baker's cyst on the right. Results were given to Richardean Canal PA.  12/26/16 1:38 PM Olen Cordial RVT

## 2016-12-26 NOTE — Telephone Encounter (Signed)
Pt has appt at [facility] TODAY at 10am pt is to arrive at 945am to register at admitting thru the main entrance. Called pt left   This encounter was created in error - please disregard.

## 2017-01-06 ENCOUNTER — Telehealth (INDEPENDENT_AMBULATORY_CARE_PROVIDER_SITE_OTHER): Payer: Self-pay | Admitting: Orthopaedic Surgery

## 2017-01-06 NOTE — Telephone Encounter (Signed)
Pt requested refill of pain medication please.  [phone]

## 2017-01-06 NOTE — Telephone Encounter (Signed)
Please advise 

## 2017-01-07 ENCOUNTER — Other Ambulatory Visit (INDEPENDENT_AMBULATORY_CARE_PROVIDER_SITE_OTHER): Payer: Self-pay | Admitting: Orthopaedic Surgery

## 2017-01-07 MED ORDER — OXYCODONE-ACETAMINOPHEN 5-325 MG PO TABS: 1.0000 | ORAL_TABLET | Freq: Three times a day (TID) | ORAL | 0 refills | Status: DC | PRN

## 2017-01-07 NOTE — Telephone Encounter (Signed)
Can come and pick up script 

## 2017-01-07 NOTE — Telephone Encounter (Signed)
Patient aware Rx ready at front desk  

## 2017-01-19 ENCOUNTER — Ambulatory Visit (INDEPENDENT_AMBULATORY_CARE_PROVIDER_SITE_OTHER): Payer: Worker's Compensation | Admitting: Orthopaedic Surgery

## 2017-01-19 ENCOUNTER — Ambulatory Visit (INDEPENDENT_AMBULATORY_CARE_PROVIDER_SITE_OTHER): Payer: Worker's Compensation

## 2017-01-19 DIAGNOSIS — M25551 Pain in right hip: Secondary | ICD-10-CM

## 2017-01-19 DIAGNOSIS — S72141D Displaced intertrochanteric fracture of right femur, subsequent encounter for closed fracture with routine healing: Secondary | ICD-10-CM

## 2017-01-19 NOTE — Progress Notes (Signed)
The patient is now 6 weeks status post open reduction internal fixation of a right intertrochanteric hip/proximal femur fracture. She family with a cane and doing well. 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  On examination of her right hip she Verlon AuLeslie put her through internal/external rotation as well as flexion extension much easier. She still having some pain to be expected. Her knee exam is normal. She family with a cane. There is definitely some hip weakness. Her incisions well-healed.  X-rays of her right hip show well-seated implant with no, getting features and interval healing of her fracture.  She'll continue increase her activities and work on hip abduction strengthening exercises. I'll need to keep her out of work for least 4 more weeks. I'll see her back in 4 weeks with repeat AP and lateral of her right hip.

## 2017-01-29 ENCOUNTER — Telehealth (INDEPENDENT_AMBULATORY_CARE_PROVIDER_SITE_OTHER): Payer: Self-pay

## 2017-01-29 NOTE — Telephone Encounter (Signed)
Faxed the 01/21/17 office note to adj per her request

## 2017-02-19 ENCOUNTER — Ambulatory Visit (INDEPENDENT_AMBULATORY_CARE_PROVIDER_SITE_OTHER): Payer: Worker's Compensation

## 2017-02-19 ENCOUNTER — Ambulatory Visit (INDEPENDENT_AMBULATORY_CARE_PROVIDER_SITE_OTHER): Payer: Worker's Compensation | Admitting: Orthopaedic Surgery

## 2017-02-19 DIAGNOSIS — S72141D Displaced intertrochanteric fracture of right femur, subsequent encounter for closed fracture with routine healing: Secondary | ICD-10-CM

## 2017-02-19 DIAGNOSIS — Z96641 Presence of right artificial hip joint: Secondary | ICD-10-CM

## 2017-02-19 MED ORDER — HYDROCODONE-ACETAMINOPHEN 5-325 MG PO TABS: 1.0000 | ORAL_TABLET | Freq: Two times a day (BID) | ORAL | 0 refills | Status: DC | PRN

## 2017-02-19 NOTE — Progress Notes (Signed)
The patient is now between 9 and 10 weeks status post open reduction internal fixation of a complex right displaced intertrochanteric hip fracture. We placed a long rod and screw in her hip. She is ambulating with a cane and reports that she is doing well overall. 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 She is ready go back to work at this point she states. She does still have some pain is to be expected at this amount of time less than 3 months status post this injury and surgery.  On examination she tolerates me easily putting her right hip through internal/external rotation. Her hip incision is well-healed. Her leg lengths appear normal.  X-rays of the pelvis and right hip show interval healing of her right trochanteric fracture.  At this point I do feel that she is going on to heal this completely. She is already doing so well. I showed her hip abduction and flexion exercises to work on strengthening at this point she'll return to full activities with work with slowly gradually increasing activities with high impact aerobic activities between 4 and 6 weeks from now and heavy lifting between 4 and 6 weeks from now. She'll give up her cane when she is coming with this. A disability rating will be forthcoming. All questions were encouraged and answered.

## 2017-02-21 ENCOUNTER — Other Ambulatory Visit (INDEPENDENT_AMBULATORY_CARE_PROVIDER_SITE_OTHER): Payer: Self-pay | Admitting: Orthopaedic Surgery

## 2018-10-01 IMAGING — CR DG HIP (WITH OR WITHOUT PELVIS) 2-3V*R*
3 series · 4 of 4 positions shown · non-contrast
Comparison: None available

CLINICAL DATA: Fall, acute right hip pain

EXAM:
DG HIP (WITH OR WITHOUT PELVIS) 2-3V RIGHT

[pelvis ap]
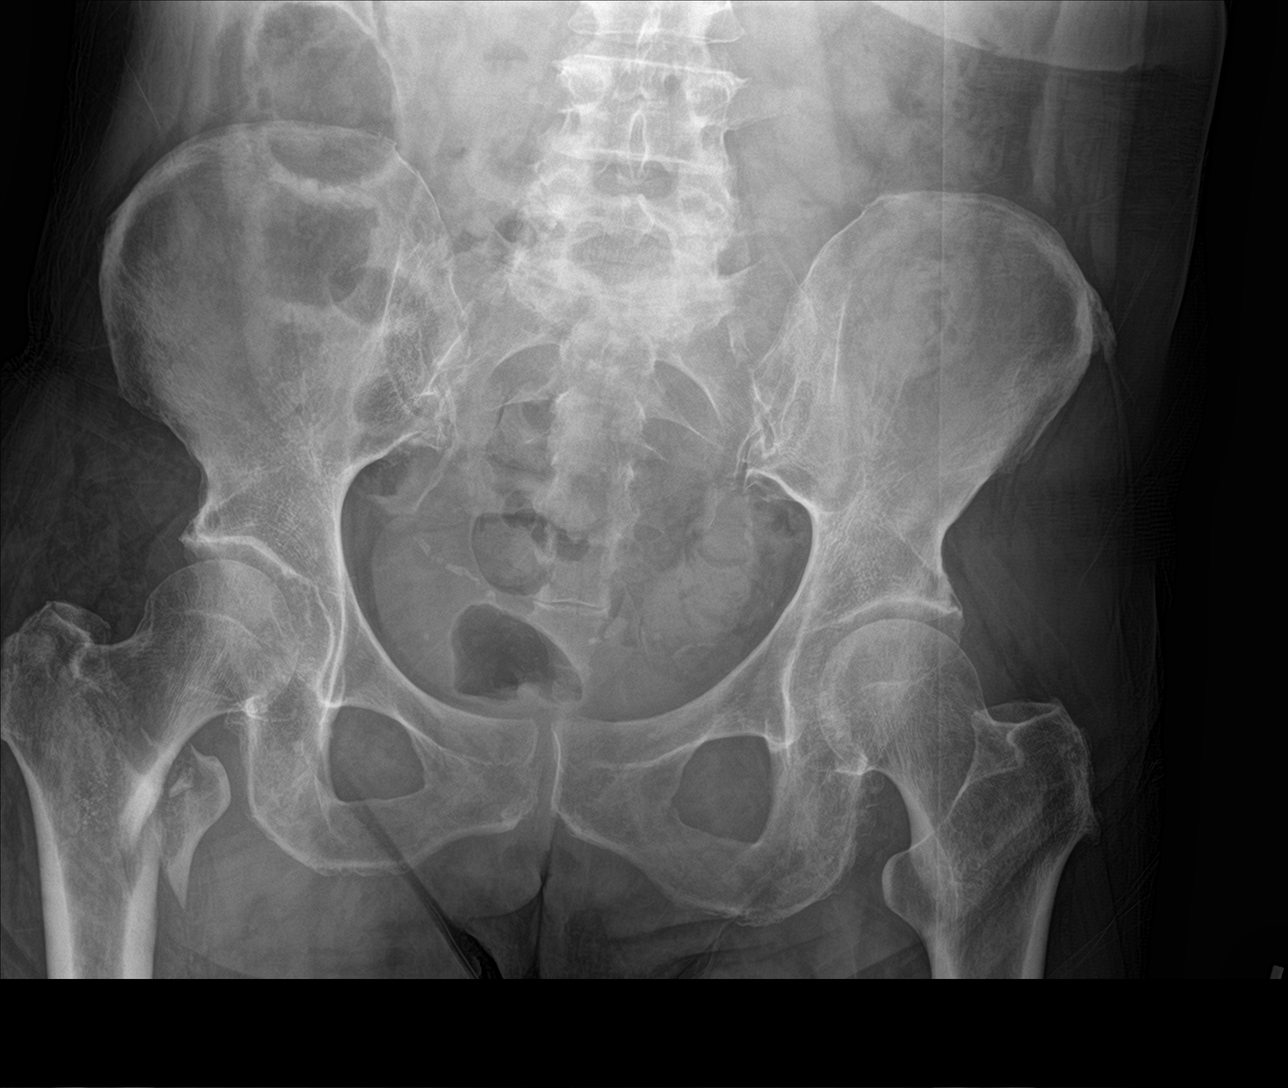

[hip ap]
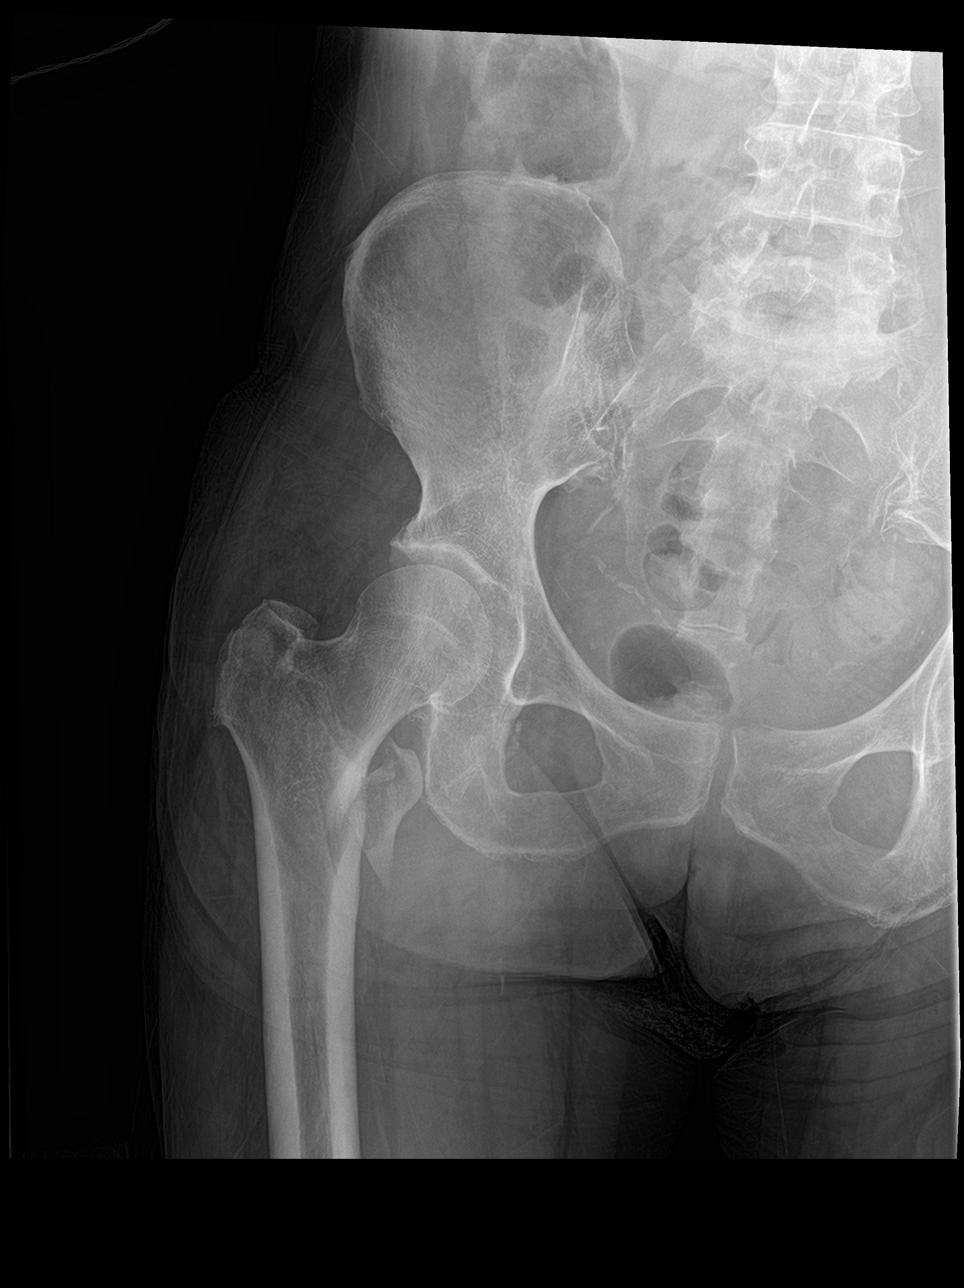

[Series 3: hip lat · 0.14mm/px · 2 of 2 slices shown]
[im 1/2]
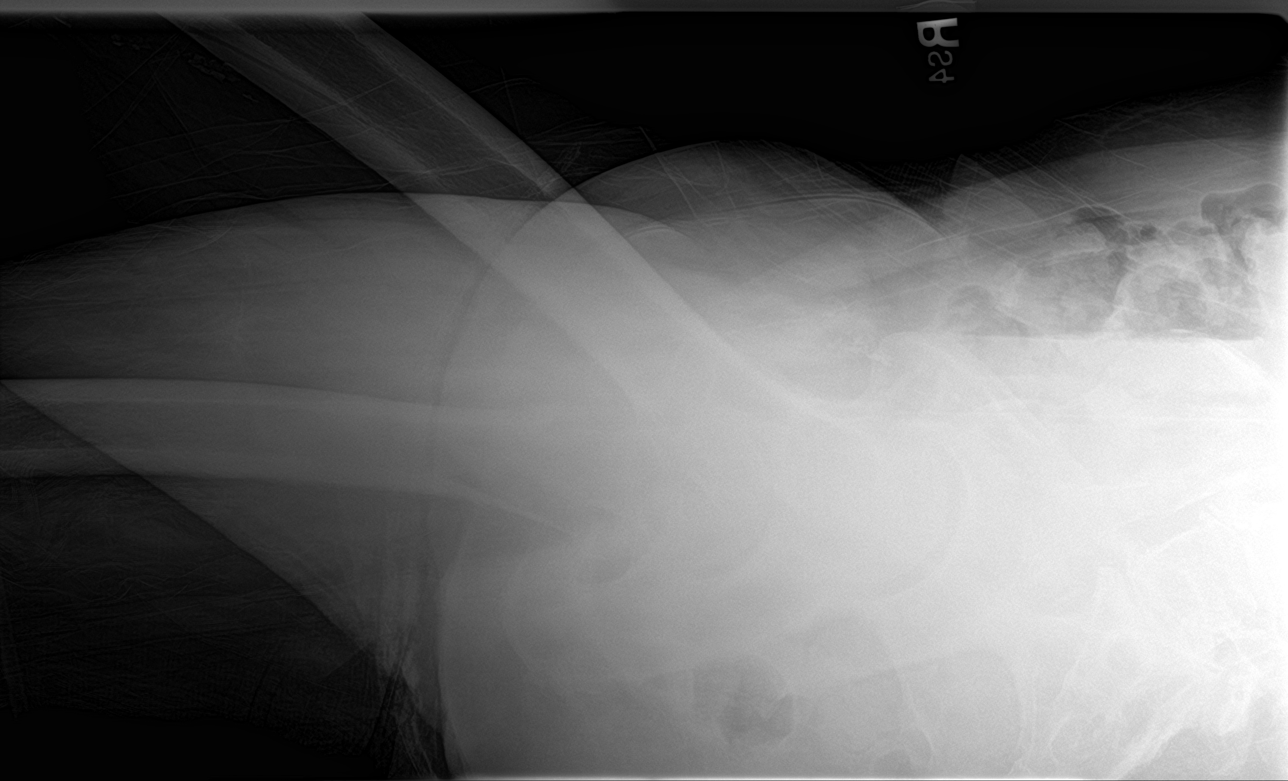
[im 2/2]
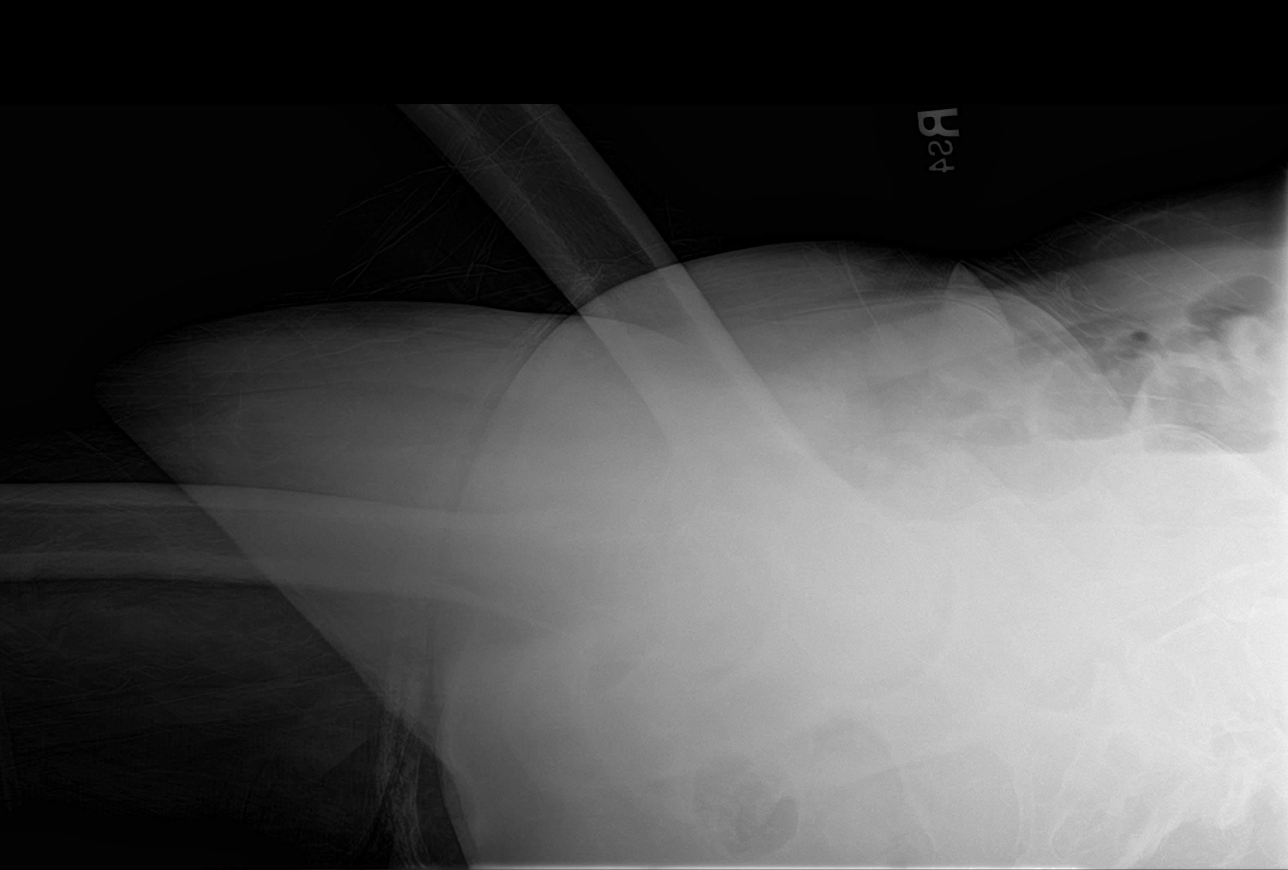

[4 of 4 positions shown; findings below may reference images not displayed]

FINDINGS: There is an acute minimally displaced right hip intertrochanteric
fracture. No associated right hip subluxation or dislocation. Bony
pelvis intact. Minor degenerative change of the lumbar sacral spine
and SI joints. Left hip appears grossly intact. Cross-table lateral
views are limited because of positioning.
IMPRESSION: Acute minimally displaced right hip intertrochanteric fracture.

## 2021-04-29 ENCOUNTER — Ambulatory Visit (INDEPENDENT_AMBULATORY_CARE_PROVIDER_SITE_OTHER): Payer: Medicare HMO

## 2021-04-29 ENCOUNTER — Other Ambulatory Visit: Payer: Self-pay

## 2021-04-29 ENCOUNTER — Ambulatory Visit (INDEPENDENT_AMBULATORY_CARE_PROVIDER_SITE_OTHER): Payer: Medicare HMO | Admitting: Orthopaedic Surgery

## 2021-04-29 DIAGNOSIS — M5442 Lumbago with sciatica, left side: Secondary | ICD-10-CM

## 2021-04-29 DIAGNOSIS — G8929 Other chronic pain: Secondary | ICD-10-CM

## 2021-04-29 DIAGNOSIS — M4807 Spinal stenosis, lumbosacral region: Secondary | ICD-10-CM

## 2021-04-29 MED ORDER — METHOCARBAMOL 500 MG PO TABS: 500.0000 mg | ORAL_TABLET | Freq: Four times a day (QID) | ORAL | 0 refills | Status: DC | PRN

## 2021-04-29 MED ORDER — MELOXICAM 15 MG PO TABS: 15.0000 mg | ORAL_TABLET | Freq: Every day | ORAL | 3 refills | Status: DC

## 2021-04-29 NOTE — Progress Notes (Signed)
Office Visit Note   Patient: Carol Lutz           Date of Birth: Sep 26, 1955           MRN: [id number] Visit Date: 04/29/2021              Requested by: Angelica Chessman, MD [address] [place],  [place] [address] PCP: Angelica Chessman, MD   Assessment & Plan: Visit Diagnoses:  1. Chronic bilateral low back pain with left-sided sciatica     Plan: Given her worsening radicular symptoms and low back pain combined with the failure of conservative treatment including aggressive chiropractic treatment for well over a year, a MRI of the lumbar spine is warranted to rule out nerve compression and stenosis.  We will try some meloxicam and methocarbamol in the interim while we await this MRI.  All question concerns were answered and addressed.   Follow-Up Instructions: No follow-ups on file.   Orders:  Orders Placed This Encounter  Procedures   XR Lumbar Spine 2-3 Views   Meds ordered this encounter  Medications   methocarbamol (ROBAXIN) 500 MG tablet    Sig: Take 1 tablet (500 mg total) by mouth every 6 (six) hours as needed for muscle spasms.    Dispense:  60 tablet    Refill:  0   meloxicam (MOBIC) 15 MG tablet    Sig: Take 1 tablet (15 mg total) by mouth daily.    Dispense:  30 tablet    Refill:  3      Procedures: No procedures performed   Clinical Data: No additional findings.   Subjective: Chief Complaint  Patient presents with   Lower Back - Pain  The patient comes in for evaluation treatment of worsening low back pain with bilateral sciatica and radicular symptoms that radiate down into both feet this been going on for several years now and is gotten worse over the last several months.  Her pain does wake her up at night.  She has gone through physical therapy on a regular basis for many years now and continues to go to a chiropractor and see chiropractic treatment 3 times weekly.  She has been on anti-inflammatories.  She is diabetic with a  hemoglobin A1c of 7.2 but does not have any peripheral neuropathy.  She denies any specific injuries but she is getting more radicular symptoms as each week and each month goes by and is becoming more debilitating for her.  She denies any change in bowel bladder function.  She is an active 65 years old and is thin.  HPI  Review of Systems There is no listed headache, chest pain, shortness of breath, fever, chills, nausea, vomiting  Objective: Vital Signs: There were no vitals taken for this visit.  Physical Exam She is alert and oriented x3 and in no acute distress Ortho Exam Examination of her lower extremity shows excellent strength in her bilateral lower extremities but a positive straight leg raise bilaterally.  She has good flexion extension of the lumbar spine.  There is subjective numbness in the L5 distribution bilaterally. Specialty Comments:  No specialty comments available.  Imaging: XR Lumbar Spine 2-3 Views  Result Date: 04/29/2021 2 views of the lumbar spine show slight decrease in lumbar lordosis.  There is a old compression fracture of L1 with about 20% loss of height.  There is significant degenerative changes between L5 and S1.    PMFS History: Patient Active Problem List  Diagnosis Date Noted   Closed right hip fracture (HCC) 12/05/2016   Closed comminuted intertrochanteric fracture of right femur (HCC) 12/05/2016   Closed intertrochanteric fracture of hip, right, initial encounter [facility])    Past Medical History:  Diagnosis Date   Diabetes mellitus without complication (HCC)    Hyperlipemia    Hypertension     No family history on file.  Past Surgical History:  Procedure Laterality Date   INTRAMEDULLARY (IM) NAIL INTERTROCHANTERIC Right 12/05/2016   Procedure: ORIF RIGHT HIP FACTURE;  Surgeon: Kathryne Hitch, MD;  Location: [facility] OR;  Service: Orthopedics;  Laterality: Right;   Social History   Occupational History   Not on file  Tobacco Use    Smoking status: Never   Smokeless tobacco: Never  Substance and Sexual Activity   Alcohol use: No   Drug use: No   Sexual activity: Not on file

## 2021-05-22 ENCOUNTER — Ambulatory Visit
Admission: RE | Admit: 2021-05-22 | Discharge: 2021-05-22 | Disposition: A | Discharge status: Home / Self Care | Payer: Medicare HMO | Source: Ambulatory Visit | Attending: Orthopaedic Surgery | Admitting: Orthopaedic Surgery

## 2021-05-22 ENCOUNTER — Other Ambulatory Visit: Payer: Self-pay

## 2021-05-22 DIAGNOSIS — M4807 Spinal stenosis, lumbosacral region: Secondary | ICD-10-CM

## 2021-05-28 ENCOUNTER — Encounter: Payer: Self-pay | Admitting: Orthopaedic Surgery

## 2021-05-28 ENCOUNTER — Ambulatory Visit: Payer: Medicare HMO | Admitting: Orthopaedic Surgery

## 2021-05-28 DIAGNOSIS — M5442 Lumbago with sciatica, left side: Secondary | ICD-10-CM

## 2021-05-28 DIAGNOSIS — M4807 Spinal stenosis, lumbosacral region: Secondary | ICD-10-CM | POA: Diagnosis not present

## 2021-05-28 DIAGNOSIS — G8929 Other chronic pain: Secondary | ICD-10-CM | POA: Diagnosis not present

## 2021-05-28 NOTE — Progress Notes (Signed)
The patient comes in today to go over a MRI of her lumbar spine.  She is a very thin 66 year old female had been developing bilateral sciatica that radiates down to both knees with the worst on the left than the right.  She denies any change in bowel or bladder function or weakness in her feet.  She is well-known to me.  This is been slowly getting worse for her and it was appropriate at this point to send her for a MRI.  I did go over the MRI with her and we looked at the studies themselves.  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  She still reporting the same low back pain that radiates into the sciatic area on both sides.  On exam her hips move smoothly and fluidly.  She does have pain with sciatic stretch on both sides.  She has excellent strength in about lower extremities.  At this point she is interested in at least an intervention in her back through Dr. Alvester Morin.  I showed her a spine model and explained in detail what this involves.  She is not on blood thinning medication.  We will send the order in to Dr. Alvester Morin to consider bilateral L5-S1 epidural steroid injections.  She agrees with this referral.

## 2021-05-29 NOTE — Addendum Note (Signed)
Addended by: Barbette Or on: 05/29/2021 11:14 AM   Modules accepted: Orders

## 2021-06-12 ENCOUNTER — Other Ambulatory Visit: Payer: Self-pay

## 2021-06-12 ENCOUNTER — Telehealth: Payer: Self-pay

## 2021-06-12 DIAGNOSIS — M4807 Spinal stenosis, lumbosacral region: Secondary | ICD-10-CM

## 2021-06-12 NOTE — Telephone Encounter (Signed)
Received denial letter that stated pt needs to do 4 weeks of therapy prior to approving ESI. I called and advised pt and she stated understanding to this. Therapy referral was placed in the chart.

## 2021-07-16 ENCOUNTER — Other Ambulatory Visit: Payer: Self-pay

## 2021-07-16 ENCOUNTER — Ambulatory Visit: Payer: Medicare HMO | Admitting: Rehabilitative and Restorative Service Providers"

## 2021-07-16 ENCOUNTER — Encounter: Payer: Self-pay | Admitting: Rehabilitative and Restorative Service Providers"

## 2021-07-16 DIAGNOSIS — G8929 Other chronic pain: Secondary | ICD-10-CM

## 2021-07-16 DIAGNOSIS — M5442 Lumbago with sciatica, left side: Secondary | ICD-10-CM

## 2021-07-16 DIAGNOSIS — M5441 Lumbago with sciatica, right side: Secondary | ICD-10-CM

## 2021-07-16 NOTE — Therapy (Addendum)
[facility] [address] [place], [place], [address] Phone: [phone]   Fax:  [phone]  Physical Therapy Evaluation /Discharge  Patient Details  Name: Carol Lutz MRN: [id number] Date of Birth: [birth year] Referring Provider (PT): Mcarthur Rossetti, MD   Encounter Date: 07/16/2021   PT End of Session - 07/16/21 1050     Visit Number 1    Number of Visits 1    Date for PT Re-Evaluation 58/85/02   Recertification required upon any return (1 visit only)   Authorization Type [employer] Medicare $35 copay    Progress Note Due on Visit --    PT Start Time 1057    PT Stop Time 1107    PT Time Calculation (min) 10 min    Activity Tolerance --    Behavior During Therapy Agitated             Past Medical History:  Diagnosis Date   Diabetes mellitus without complication ([place])    Hyperlipemia    Hypertension     Past Surgical History:  Procedure Laterality Date   INTRAMEDULLARY (IM) NAIL INTERTROCHANTERIC Right 12/05/2016   Procedure: ORIF RIGHT HIP FACTURE;  Surgeon: Mcarthur Rossetti, MD;  Location: [facility];  Service: Orthopedics;  Laterality: Right;    There were no vitals filed for this visit.    Subjective Assessment - 07/16/21 1101     Subjective Pt. indicated lower back symptoms and into bilateral upper LE.  Pt. indicated "trouble for years."   Pt. indicated she has seen a chiro for 30 years and would rather be there but was here today because her insurance required it for an injection.                [facility] PT Assessment - 07/16/21 0001       Assessment   Medical Diagnosis M48.07 (ICD-10-CM) - Spinal stenosis of lumbosacral region    Referring Provider (PT) Mcarthur Rossetti, MD      Balance Screen   Has the patient fallen in the past 6 months No    Has the patient had a decrease in activity level because of a fear of falling?  No    Is the patient reluctant to leave their home because of a fear of  falling?  No      Observation/Other Assessments   Focus on Therapeutic Outcomes (FOTO)  --      Posture/Postural Control   Posture Comments Mild forward trunk lean in standing/ambulation, reduced lumbar lordosis noted      ROM / Strength   AROM / PROM / Strength Strength;PROM;AROM      AROM   AROM Assessment Site Lumbar      Strength   Strength Assessment Site Hip;Knee;Ankle    Right/Left Hip Right;Left    Right/Left Knee Left;Right                        Objective measurements completed on examination: See above findings.       [facility] Adult PT Treatment/Exercise - 07/16/21 0001       Exercises   Exercises --    Other Exercises  --                     PT Education - 07/16/21 1050     Education Details --    Person(s) Educated --    Methods --    Comprehension --  PT Short Term Goals - 07/16/21 1051       PT SHORT TERM GOAL #1   Title --    Time --    Period --    Status --    Target Date --               PT Long Term Goals - 07/16/21 1051       PT LONG TERM GOAL #1   Title --    Time --    Period --    Status --    Target Date --      PT LONG TERM GOAL #2   Title --    Time --    Period --    Status --    Target Date --      PT LONG TERM GOAL #3   Title --    Time --    Period --    Status --    Target Date --                    Plan - 07/16/21 1054     Clinical Impression Statement Patient is a 64 y.o. who comes to clinic with complaints of low back, bilateral LE pain.  At beginning of session, standard practice of screening questions (medical history, Medical directives etc) was performed with Pt. becoming agitated during process.  Pt. stated she was just here today because her insurance required it and she would prefer to be at a chiropractor when she had "been seeing for 30 years."  When clinical intervention was performed for pertinent subjective information regarding  condition, Pt. was frustrated and claimed that all the information should be in the chart.  Pt. was asked to cover nose and mouth per clinic protocol for mask wearing and became more agitated and when asked to place her phone down unless it was an emergency(in order to facilitate clinical interview process), Pt. stood up and stormed out of the treatment room and left the building and indicated she did not want to schedule a return.  Evaluation was stopped at that time and concluded.    Personal Factors and Comorbidities Comorbidity 3+    Comorbidities DM, Hyperlipidemia, HTN    Stability/Clinical Decision Making Stable/Uncomplicated    Clinical Decision Making Low    Rehab Potential --    PT Frequency One time visit    PT Duration --    PT Treatment/Interventions ADLs/Self Care Home Management;Cryotherapy;Electrical Stimulation;Iontophoresis 47m/ml Dexamethasone;Moist Heat;Traction;Balance training;Therapeutic exercise;Therapeutic activities;Functional mobility training;Stair training;Gait training;DME Instruction;Ultrasound;Neuromuscular re-education;Passive range of motion;Spinal Manipulations;Joint Manipulations;Dry needling;Taping;Manual techniques    PT Next Visit Plan Recertification and continued evaluation process required upon return.    Consulted and Agree with Plan of Care Patient             Patient will benefit from skilled therapeutic intervention in order to improve the following deficits and impairments:     Visit Diagnosis: Chronic bilateral low back pain with bilateral sciatica - Plan: PT plan of care cert/re-cert     Problem List Patient Active Problem List   Diagnosis Date Noted   Closed right hip fracture ([facility] 12/05/2016   Closed comminuted intertrochanteric fracture of right femur ([facility] 12/05/2016   Closed intertrochanteric fracture of hip, right, initial encounter ([facility]     PHYSICAL THERAPY DISCHARGE SUMMARY  Visits from Start of Care: 1  Current  functional level related to goals / functional outcomes: See note   Remaining deficits: See  note   Education / Equipment: HEP   Patient agrees to discharge. Patient goals were not met. Patient is being discharged due to  Pt. Walked out of evaluation (see above).  Scot Jun, PT, DPT, OCS, ATC 07/16/21  1:24 PM     [facility] [address] [place], [place], [address] Phone: [phone]   Fax:  [phone]  Name: Carol Lutz MRN: [id number] Date of Birth: 08/24/1956

## 2021-08-27 ENCOUNTER — Ambulatory Visit: Payer: Medicare HMO | Admitting: Physical Therapy

## 2021-08-27 ENCOUNTER — Encounter: Payer: Self-pay | Admitting: Physical Therapy

## 2021-08-27 ENCOUNTER — Other Ambulatory Visit: Payer: Self-pay

## 2021-08-27 DIAGNOSIS — R29898 Other symptoms and signs involving the musculoskeletal system: Secondary | ICD-10-CM | POA: Diagnosis not present

## 2021-08-27 DIAGNOSIS — M5442 Lumbago with sciatica, left side: Secondary | ICD-10-CM | POA: Diagnosis not present

## 2021-08-27 DIAGNOSIS — M6281 Muscle weakness (generalized): Secondary | ICD-10-CM

## 2021-08-27 DIAGNOSIS — M5441 Lumbago with sciatica, right side: Secondary | ICD-10-CM | POA: Diagnosis not present

## 2021-08-27 DIAGNOSIS — G8929 Other chronic pain: Secondary | ICD-10-CM

## 2021-08-27 NOTE — Therapy (Signed)
[facility] Physical [facility] [address] [place], [place], [address] Phone: [phone]   Fax:  [phone]  Physical Therapy Evaluation  Patient Details  Name: Carol Lutz MRN: [id number] Date of Birth: 16-Feb-1956 Referring Provider (PT): Kathryne Hitch, MD   Encounter Date: 08/27/2021   PT End of Session - 08/27/21 1143     Visit Number 1    Number of Visits 4    Date for PT Re-Evaluation 09/24/21    Authorization Type [employer] Medicare $35 copay    PT Start Time 1100    PT Stop Time 1135    PT Time Calculation (min) 35 min    Activity Tolerance Patient tolerated treatment well    Behavior During Therapy [facility] for tasks assessed/performed             Past Medical History:  Diagnosis Date   Diabetes mellitus without complication (HCC)    Hyperlipemia    Hypertension     Past Surgical History:  Procedure Laterality Date   INTRAMEDULLARY (IM) NAIL INTERTROCHANTERIC Right 12/05/2016   Procedure: ORIF RIGHT HIP FACTURE;  Surgeon: Kathryne Hitch, MD;  Location: [facility] OR;  Service: Orthopedics;  Laterality: Right;    There were no vitals filed for this visit.    Subjective Assessment - 08/27/21 1101     Subjective Pt. indicated lower back symptoms and into bilateral upper LE.  Pt. indicated "trouble for years."   Pt. indicated she has seen a chiro for 30 years and would rather be there but was here today because her insurance required it for an injection.    Pertinent History DM, Hyperlipidemia, HTN; hx hip fx 4.5 years ago with IM nail    Patient Stated Goals improve pain; needs 4 weeks for injection approval    Currently in Pain? Yes    Pain Score 0-No pain   up to 8/10   Pain Location Back    Pain Orientation Right;Left;Lower    Pain Descriptors / Indicators Aching    Pain Type Chronic pain    Pain Onset More than a month ago    Pain Frequency Intermittent    Aggravating Factors  worse at night    Pain Relieving Factors  ibuprofen                [facility] PT Assessment - 08/27/21 1104       Assessment   Medical Diagnosis M48.07 (ICD-10-CM) - Spinal stenosis of lumbosacral region    Referring Provider (PT) Kathryne Hitch, MD    Onset Date/Surgical Date --   chronic   Hand Dominance Right    Next MD Visit PRN - awaiting injection    Prior Therapy none      Precautions   Precautions None      Restrictions   Weight Bearing Restrictions No      Balance Screen   Has the patient fallen in the past 6 months No    Has the patient had a decrease in activity level because of a fear of falling?  No    Is the patient reluctant to leave their home because of a fear of falling?  No      Home Environment   Living Environment Private residence    Living Arrangements Spouse/significant other    Type of Home House    Home Access Stairs to enter    Entrance Stairs-Number of Steps 2    Home Layout Two level;Bed/bath upstairs    Additional Comments denies  difficulty with stairs      Prior Function   Level of Independence Independent    Vocation Part time employment    Arboriculturist business - sales; occasional heavy lifting, traveling    Leisure camping, tries to walk - hip hurts too much      Cognition   Overall Cognitive Status Within Functional Limits for tasks assessed      Observation/Other Assessments   Focus on Therapeutic Outcomes (FOTO)  53 (predicted 64)      Posture/Postural Control   Posture/Postural Control Postural limitations    Postural Limitations Rounded Shoulders;Forward head      AROM   Lumbar Flexion WNL - directional preference    Lumbar Extension limited 25%    Lumbar - Right Side Bend WNL    Lumbar - Left Side Bend WNL    Lumbar - Right Rotation WNL    Lumbar - Left Rotation WNL with Rt side stretch      Strength   Right Hip Flexion 3+/5    Right Hip Extension 3/5    Right Hip ABduction 3/5    Left Hip Flexion 3+/5    Left Hip Extension 3/5     Left Hip ABduction 4/5    Right Knee Flexion 5/5    Right Knee Extension 5/5    Left Knee Flexion 5/5    Left Knee Extension 5/5      Flexibility   Soft Tissue Assessment /Muscle Length yes    Hamstrings mild tightness bil      Palpation   Palpation comment trigger points and tightness noted in Lt gastroc soleus      Special Tests    Special Tests Lumbar    Lumbar Tests Slump Test      Slump test   Findings Negative                        Objective measurements completed on examination: See above findings.       [facility] Adult PT Treatment/Exercise - 08/27/21 1104       Exercises   Exercises Other Exercises    Other Exercises  see pt instructions - performed PRN with cues for technique                     PT Education - 08/27/21 1138     Education Details HEP    Person(s) Educated Patient    Methods Explanation;Demonstration;Handout    Comprehension Verbalized understanding;Returned demonstration;Need further instruction              PT Short Term Goals - 07/16/21 1051       PT SHORT TERM GOAL #1   Title --    Time --    Period --    Status --    Target Date --               PT Long Term Goals - 08/27/21 1146       PT LONG TERM GOAL #1   Title independent with HEP    Time 4    Period Weeks    Status New    Target Date 09/24/21      PT LONG TERM GOAL #2   Title report pain < 3/10 with activity for improved function    Time 4    Period Weeks    Status New    Target Date 09/24/21      PT LONG  TERM GOAL #3   Title FOTO score improved to 64 for improved function    Time 4    Period Weeks    Status New    Target Date 09/24/21      PT LONG TERM GOAL #4   Title report ability to walk > 30 min with pain < 4/10 for improved function    Time 4    Period Weeks    Status New    Target Date 09/24/21                    Plan - 08/27/21 1143     Clinical Impression Statement Patient is a 65 y.o. who  comes to clinic with complaints of low back, bilateral LE pain.  Pt demonstrates decreased strength and flexibility as well as chronic pain and trigger points affecting functional mobility.  She will benefit from PT to address deficits listed.    Personal Factors and Comorbidities Comorbidity 3+    Comorbidities DM, Hyperlipidemia, HTN    Examination-Activity Limitations Locomotion Level;Transfers;Sit;Stairs;Stand;Lift;Squat    Examination-Participation Restrictions Cleaning;Meal Prep;Community Activity;Occupation;Driving    Stability/Clinical Decision Making Evolving/Moderate complexity    Clinical Decision Making Moderate    PT Frequency 1x / week    PT Duration 4 weeks    PT Treatment/Interventions ADLs/Self Care Home Management;Cryotherapy;Electrical Stimulation;Iontophoresis 4mg /ml Dexamethasone;Moist Heat;Traction;Balance training;Therapeutic exercise;Therapeutic activities;Functional mobility training;Stair training;Gait training;DME Instruction;Ultrasound;Neuromuscular re-education;Passive range of motion;Spinal Manipulations;Joint Manipulations;Dry needling;Taping;Manual techniques    PT Next Visit Plan review HEP, consider DN to Lt gastroc/soleus, hip/core strengthening    PT Home Exercise Plan Access Code:    Consulted and Agree with Plan of Care Patient             Patient will benefit from skilled therapeutic intervention in order to improve the following deficits and impairments:  Pain, Increased fascial restricitons, Increased muscle spasms, Decreased mobility, Decreased endurance, Decreased range of motion, Decreased strength, Impaired flexibility  Visit Diagnosis: Chronic bilateral low back pain with bilateral sciatica - Plan: PT plan of care cert/re-cert  Muscle weakness (generalized) - Plan: PT plan of care cert/re-cert  Other symptoms and signs involving the musculoskeletal system - Plan: PT plan of care cert/re-cert     Problem List Patient Active  Problem List   Diagnosis Date Noted   Closed right hip fracture (HCC) 12/05/2016   Closed comminuted intertrochanteric fracture of right femur (HCC) 12/05/2016   Closed intertrochanteric fracture of hip, right, initial encounter (HCC)       12/07/2016, PT, DPT 08/27/21 11:51 AM     [facility] [address] [place], [place], [place] Phone: [phone]   Fax:  [phone]  Name: Carol Lutz MRN: Francee Nodal Date of Birth: Jan 09, 1956

## 2021-08-27 NOTE — Patient Instructions (Signed)
Access Code: [email or web address] URL: [email or web address] Date: 08/27/2021 Prepared by: Moshe Cipro  Exercises Hooklying Single Knee to Chest - 2 x daily - 7 x weekly - 1 sets - 3 reps - 30 sec hold Supine Piriformis Stretch with Foot on Ground - 2 x daily - 7 x weekly - 1 sets - 3 reps - 30 sec hold Seated Hamstring Stretch - 2 x daily - 7 x weekly - 1 sets - 3 reps - 30 sec hold Supine Bridge - 2 x daily - 7 x weekly - 1 sets - 10 reps - 5 sec hold Sidelying Hip Abduction - 2 x daily - 7 x weekly - 1 sets - 10 reps

## 2021-09-04 ENCOUNTER — Ambulatory Visit: Payer: Medicare HMO | Admitting: Physical Therapy

## 2021-09-04 ENCOUNTER — Encounter: Payer: Self-pay | Admitting: Physical Therapy

## 2021-09-04 ENCOUNTER — Other Ambulatory Visit: Payer: Self-pay

## 2021-09-04 DIAGNOSIS — M5442 Lumbago with sciatica, left side: Secondary | ICD-10-CM | POA: Diagnosis not present

## 2021-09-04 DIAGNOSIS — G8929 Other chronic pain: Secondary | ICD-10-CM

## 2021-09-04 DIAGNOSIS — R29898 Other symptoms and signs involving the musculoskeletal system: Secondary | ICD-10-CM

## 2021-09-04 DIAGNOSIS — M6281 Muscle weakness (generalized): Secondary | ICD-10-CM | POA: Diagnosis not present

## 2021-09-04 DIAGNOSIS — M5441 Lumbago with sciatica, right side: Secondary | ICD-10-CM

## 2021-09-04 NOTE — Therapy (Signed)
[facility] Physical Therapy [address] [place], [place], [address] Phone: [phone]   Fax:  [phone]  Physical Therapy Treatment  Patient Details  Name: Carol Lutz MRN: [id number] Date of Birth: Nov 25, 1955 Referring Provider (PT): Mcarthur Rossetti, MD   Encounter Date: 09/04/2021   PT End of Session - 09/04/21 1100     Visit Number 2    Number of Visits 4    Date for PT Re-Evaluation 09/24/21    Authorization Type AETNA Medicare $35 copay    PT Start Time 1014    PT Stop Time 1052    PT Time Calculation (min) 38 min    Activity Tolerance Patient tolerated treatment well    Behavior During Therapy [facility] for tasks assessed/performed             Past Medical History:  Diagnosis Date   Diabetes mellitus without complication ([place])    Hyperlipemia    Hypertension     Past Surgical History:  Procedure Laterality Date   INTRAMEDULLARY (IM) NAIL INTERTROCHANTERIC Right 12/05/2016   Procedure: ORIF RIGHT HIP FACTURE;  Surgeon: Mcarthur Rossetti, MD;  Location: [place];  Service: Orthopedics;  Laterality: Right;    There were no vitals filed for this visit.   Subjective Assessment - 09/04/21 1015     Subjective doing well; exercises going well.  reports no change at this time.    Pertinent History DM, Hyperlipidemia, HTN; hx hip fx 4.5 years ago with IM nail    Patient Stated Goals improve pain; needs 4 weeks for injection approval    Currently in Pain? No/denies    Pain Score 0-No pain   up to 6/10                              [facility] Adult PT Treatment/Exercise - 09/04/21 1016       Lumbar Exercises: Stretches   Passive Hamstring Stretch Right;Left;20 seconds    Passive Hamstring Stretch Limitations seated    Single Knee to Chest Stretch Right;Left;2 reps;20 seconds    Piriformis Stretch Right;Left;2 reps;20 seconds    Gastroc Stretch Right;Left;2 reps;20 seconds    Gastroc Stretch Limitations with  hamstring stretch; seated with towel    Other Lumbar Stretch Exercise demonstrated standing gastroc/soleus stretches      Lumbar Exercises: Standing   Functional Squats 15 reps    Functional Squats Limitations mod cues for technique    Other Standing Lumbar Exercises standing hip abduction and extension x10 reps each; light UE support      Lumbar Exercises: Supine   Clam 10 reps;5 seconds    Clam Limitations isometric    Bridge 10 reps;5 seconds      Lumbar Exercises: Sidelying   Hip Abduction Both;10 reps                       PT Short Term Goals - 07/16/21 1051       PT SHORT TERM GOAL #1   Title --    Time --    Period --    Status --    Target Date --               PT Long Term Goals - 09/04/21 1100       PT LONG TERM GOAL #1   Title independent with HEP    Baseline 12/21: met to date    Time 4  Period Weeks    Status On-going    Target Date 09/24/21      PT LONG TERM GOAL #2   Title report pain < 3/10 with activity for improved function    Time 4    Period Weeks    Status On-going    Target Date 09/24/21      PT LONG TERM GOAL #3   Title FOTO score improved to 64 for improved function    Time 4    Period Weeks    Status On-going    Target Date 09/24/21      PT LONG TERM GOAL #4   Title report ability to walk > 30 min with pain < 4/10 for improved function    Time 4    Period Weeks    Status On-going    Target Date 09/24/21                   Plan - 09/04/21 1100     Clinical Impression Statement Pt demonstrated understanding of initial HEP and is compliant with current HEP.  No pain reported with exercises today, but pain still up to 6/10 at times.  Will continue to benefit from PT to maximize function.    Personal Factors and Comorbidities Comorbidity 3+    Comorbidities DM, Hyperlipidemia, HTN    Examination-Activity Limitations Locomotion Level;Transfers;Sit;Stairs;Stand;Lift;Squat    Examination-Participation  Restrictions Cleaning;Meal Prep;Community Activity;Occupation;Driving    Stability/Clinical Decision Making Evolving/Moderate complexity    PT Frequency 1x / week    PT Duration 4 weeks    PT Treatment/Interventions ADLs/Self Care Home Management;Cryotherapy;Electrical Stimulation;Iontophoresis 48m/ml Dexamethasone;Moist Heat;Traction;Balance training;Therapeutic exercise;Therapeutic activities;Functional mobility training;Stair training;Gait training;DME Instruction;Ultrasound;Neuromuscular re-education;Passive range of motion;Spinal Manipulations;Joint Manipulations;Dry needling;Taping;Manual techniques    PT Next Visit Plan review HEP and update PRN, consider DN to Lt gastroc/soleus (discussed 12/21 - elected to defer), continue hip/core strengthening, standing exercises    PT Home Exercise Plan Access Code: [email or web address]   Consulted and Agree with Plan of Care Patient             Patient will benefit from skilled therapeutic intervention in order to improve the following deficits and impairments:  Pain, Increased fascial restricitons, Increased muscle spasms, Decreased mobility, Decreased endurance, Decreased range of motion, Decreased strength, Impaired flexibility  Visit Diagnosis: Chronic bilateral low back pain with bilateral sciatica  Muscle weakness (generalized)  Other symptoms and signs involving the musculoskeletal system     Problem List Patient Active Problem List   Diagnosis Date Noted   Closed right hip fracture ([place] 12/05/2016   Closed comminuted intertrochanteric fracture of right femur ([place] 12/05/2016   Closed intertrochanteric fracture of hip, right, initial encounter ([place]      SLaureen Abrahams PT, DPT 09/04/21 11:02 AM    [place] [place] [address]Phone: [phone]  Fax:  [phone] Name: KElexa KiviMRN: [id number] of Birth: 205-Jun-1957

## 2021-09-05 ENCOUNTER — Encounter: Payer: Medicare HMO | Admitting: Physical Therapy

## 2021-09-18 ENCOUNTER — Other Ambulatory Visit: Payer: Self-pay

## 2021-09-18 ENCOUNTER — Ambulatory Visit: Payer: Medicare HMO | Admitting: Physical Therapy

## 2021-09-18 ENCOUNTER — Encounter: Payer: Self-pay | Admitting: Physical Therapy

## 2021-09-18 DIAGNOSIS — M5441 Lumbago with sciatica, right side: Secondary | ICD-10-CM

## 2021-09-18 DIAGNOSIS — M6281 Muscle weakness (generalized): Secondary | ICD-10-CM | POA: Diagnosis not present

## 2021-09-18 DIAGNOSIS — G8929 Other chronic pain: Secondary | ICD-10-CM

## 2021-09-18 DIAGNOSIS — R29898 Other symptoms and signs involving the musculoskeletal system: Secondary | ICD-10-CM

## 2021-09-18 DIAGNOSIS — M5442 Lumbago with sciatica, left side: Secondary | ICD-10-CM

## 2021-09-18 NOTE — Therapy (Signed)
[facility] Physical Therapy [address] [place], [place], [address] Phone: [phone]   Fax:  [phone]  Physical Therapy Treatment  Patient Details  Name: Carol Lutz MRN: [id number] Date of Birth: 1956/06/29 Referring Provider (PT): Mcarthur Rossetti, MD   Encounter Date: 09/18/2021   PT End of Session - 09/18/21 1002     Visit Number 3    Number of Visits 4    Date for PT Re-Evaluation 09/24/21    Authorization Type AETNA Medicare $35 copay    PT Start Time 0929    PT Stop Time 1009    PT Time Calculation (min) 40 min    Activity Tolerance Patient tolerated treatment well    Behavior During Therapy [facility] for tasks assessed/performed             Past Medical History:  Diagnosis Date   Diabetes mellitus without complication ([place])    Hyperlipemia    Hypertension     Past Surgical History:  Procedure Laterality Date   INTRAMEDULLARY (IM) NAIL INTERTROCHANTERIC Right 12/05/2016   Procedure: ORIF RIGHT HIP FACTURE;  Surgeon: Mcarthur Rossetti, MD;  Location: [place];  Service: Orthopedics;  Laterality: Right;    There were no vitals filed for this visit.   Subjective Assessment - 09/18/21 0927     Subjective doing well; no pain today.  no changes reported    Pertinent History DM, Hyperlipidemia, HTN; hx hip fx 4.5 years ago with IM nail    Patient Stated Goals improve pain; needs 4 weeks for injection approval    Currently in Pain? No/denies                               [facility] Adult PT Treatment/Exercise - 09/18/21 0927       Exercises   Exercises Lumbar      Lumbar Exercises: Stretches   Single Knee to Chest Stretch Right;Left;2 reps;20 seconds    Piriformis Stretch Right;Left;2 reps;20 seconds    Gastroc Stretch Right;Left;3 reps;30 seconds    Gastroc Stretch Limitations slantboard      Lumbar Exercises: Aerobic   Nustep L6 x 6 min      Lumbar Exercises: Standing   Functional Squats 15 reps     Other Standing Lumbar Exercises RDL with 2# med ball x 15 reps      Lumbar Exercises: Seated   Other Seated Lumbar Exercises with 2# med ball: chest press x 15 reps; lift/chop x 15 bil      Lumbar Exercises: Supine   Bridge 10 reps;5 seconds      Lumbar Exercises: Sidelying   Clam Both;15 reps                          PT Long Term Goals - 09/04/21 1100       PT LONG TERM GOAL #1   Title independent with HEP    Baseline 12/21: met to date    Time 4    Period Weeks    Status On-going    Target Date 09/24/21      PT LONG TERM GOAL #2   Title report pain < 3/10 with activity for improved function    Time 4    Period Weeks    Status On-going    Target Date 09/24/21      PT LONG TERM GOAL #3   Title FOTO score improved to  64 for improved function    Time 4    Period Weeks    Status On-going    Target Date 09/24/21      PT LONG TERM GOAL #4   Title report ability to walk > 30 min with pain < 4/10 for improved function    Time 4    Period Weeks    Status On-going    Target Date 09/24/21                   Plan - 09/18/21 1117     Clinical Impression Statement Pt tolerated session well today without increase in pain reported.  She denies pain during sessions, but worse when lying down which is unchanged at this time.  Will assess goals next visit to determine progress at this time.    Personal Factors and Comorbidities Comorbidity 3+    Comorbidities DM, Hyperlipidemia, HTN    Examination-Activity Limitations Locomotion Level;Transfers;Sit;Stairs;Stand;Lift;Squat    Examination-Participation Restrictions Cleaning;Meal Prep;Community Activity;Occupation;Driving    Stability/Clinical Decision Making Evolving/Moderate complexity    PT Frequency 1x / week    PT Duration 4 weeks    PT Treatment/Interventions ADLs/Self Care Home Management;Cryotherapy;Electrical Stimulation;Iontophoresis 2m/ml Dexamethasone;Moist Heat;Traction;Balance  training;Therapeutic exercise;Therapeutic activities;Functional mobility training;Stair training;Gait training;DME Instruction;Ultrasound;Neuromuscular re-education;Passive range of motion;Spinal Manipulations;Joint Manipulations;Dry needling;Taping;Manual techniques    PT Next Visit Plan check goals and discuss next steps    PT Home Exercise Plan Access Code: [email or web address]   Consulted and Agree with Plan of Care Patient             Patient will benefit from skilled therapeutic intervention in order to improve the following deficits and impairments:  Pain, Increased fascial restricitons, Increased muscle spasms, Decreased mobility, Decreased endurance, Decreased range of motion, Decreased strength, Impaired flexibility  Visit Diagnosis: Chronic bilateral low back pain with bilateral sciatica  Muscle weakness (generalized)  Other symptoms and signs involving the musculoskeletal system     Problem List Patient Active Problem List   Diagnosis Date Noted   Closed right hip fracture ([place] 12/05/2016   Closed comminuted intertrochanteric fracture of right femur ([place] 12/05/2016   Closed intertrochanteric fracture of hip, right, initial encounter ([place]       SLaureen Abrahams PT, DPT 09/18/21 11:19 AM     [facility] [address] [address]Phone: [phone]  Fax:  [phone] Name: Carol StargellMRN: [id number] of Birth: [birth year]

## 2021-09-25 ENCOUNTER — Ambulatory Visit: Payer: Medicare HMO | Admitting: Physical Therapy

## 2021-09-25 ENCOUNTER — Encounter: Payer: Self-pay | Admitting: Physical Therapy

## 2021-09-25 ENCOUNTER — Other Ambulatory Visit: Payer: Self-pay

## 2021-09-25 DIAGNOSIS — G8929 Other chronic pain: Secondary | ICD-10-CM

## 2021-09-25 DIAGNOSIS — M5442 Lumbago with sciatica, left side: Secondary | ICD-10-CM | POA: Diagnosis not present

## 2021-09-25 DIAGNOSIS — M5441 Lumbago with sciatica, right side: Secondary | ICD-10-CM | POA: Diagnosis not present

## 2021-09-25 DIAGNOSIS — R29898 Other symptoms and signs involving the musculoskeletal system: Secondary | ICD-10-CM

## 2021-09-25 DIAGNOSIS — M6281 Muscle weakness (generalized): Secondary | ICD-10-CM | POA: Diagnosis not present

## 2021-09-25 NOTE — Therapy (Signed)
[facility] Physical Therapy [address] [place], [place], [address] Phone: [phone]   Fax:  [phone]  Physical Therapy Treatment/Discharge Summary  Patient Details  Name: Carol Lutz MRN: [id number] Date of Birth: 1956/06/15 Referring Provider (PT): Mcarthur Rossetti, MD   Encounter Date: 09/25/2021   PT End of Session - 09/25/21 0931     Visit Number 4    Number of Visits 4    Authorization Type AETNA Medicare $35 copay    PT Start Time 0925    PT Stop Time 1004    PT Time Calculation (min) 39 min    Activity Tolerance Patient tolerated treatment well    Behavior During Therapy [facility] for tasks assessed/performed             Past Medical History:  Diagnosis Date   Diabetes mellitus without complication ([place])    Hyperlipemia    Hypertension     Past Surgical History:  Procedure Laterality Date   INTRAMEDULLARY (IM) NAIL INTERTROCHANTERIC Right 12/05/2016   Procedure: ORIF RIGHT HIP FACTURE;  Surgeon: Mcarthur Rossetti, MD;  Location: [place];  Service: Orthopedics;  Laterality: Right;    There were no vitals filed for this visit.   Subjective Assessment - 09/25/21 0925     Subjective doing well - doesn't like the cold weather    Pertinent History DM, Hyperlipidemia, HTN; hx hip fx 4.5 years ago with IM nail    How long can you walk comfortably? 15-20 min; c/o back and hip pain up to 7-8/10    Patient Stated Goals improve pain; needs 4 weeks for injection approval    Currently in Pain? No/denies                [facility] PT Assessment - 09/25/21 0941       Assessment   Medical Diagnosis M48.07 (ICD-10-CM) - Spinal stenosis of lumbosacral region    Referring Provider (PT) Mcarthur Rossetti, MD      Observation/Other Assessments   Focus on Therapeutic Outcomes (FOTO)  25                           [facility] Adult PT Treatment/Exercise - 09/25/21 0927       Lumbar Exercises: Stretches   Passive  Hamstring Stretch Right;Left;20 seconds;2 reps    Passive Hamstring Stretch Limitations seated    Single Knee to Chest Stretch Right;Left;2 reps;20 seconds    Piriformis Stretch Right;Left;2 reps;20 seconds      Lumbar Exercises: Aerobic   Nustep L5 x 8 min      Lumbar Exercises: Supine   Bridge 10 reps;5 seconds                          PT Long Term Goals - 09/25/21 1005       PT LONG TERM GOAL #1   Title independent with HEP    Baseline 12/21: met to date    Time 4    Period Weeks    Status Achieved    Target Date 09/24/21      PT LONG TERM GOAL #2   Title report pain < 3/10 with activity for improved function    Time 4    Period Weeks    Status Not Met    Target Date 09/24/21      PT LONG TERM GOAL #3   Title FOTO score improved to 64 for improved  function    Time 4    Period Weeks    Status Not Met    Target Date 09/24/21      PT LONG TERM GOAL #4   Title report ability to walk > 30 min with pain < 4/10 for improved function    Time 4    Period Weeks    Status Not Met    Target Date 09/24/21                   Plan - 09/25/21 1005     Clinical Impression Statement Pt has only met 1 LTG at this time and demonstrates minimal to no change in pain or symptoms at this time.  Will d/c PT and recommend pt reach out to MD for next steps.    Personal Factors and Comorbidities Comorbidity 3+    Comorbidities DM, Hyperlipidemia, HTN    Examination-Activity Limitations Locomotion Level;Transfers;Sit;Stairs;Stand;Lift;Squat    Examination-Participation Restrictions Cleaning;Meal Prep;Community Activity;Occupation;Driving    Stability/Clinical Decision Making Evolving/Moderate complexity    PT Frequency 1x / week    PT Duration 4 weeks    PT Treatment/Interventions ADLs/Self Care Home Management;Cryotherapy;Electrical Stimulation;Iontophoresis 52m/ml Dexamethasone;Moist Heat;Traction;Balance training;Therapeutic exercise;Therapeutic  activities;Functional mobility training;Stair training;Gait training;DME Instruction;Ultrasound;Neuromuscular re-education;Passive range of motion;Spinal Manipulations;Joint Manipulations;Dry needling;Taping;Manual techniques    PT Next Visit Plan d/c PT today    PT Home Exercise Plan Access Code: [email or web address]   Consulted and Agree with Plan of Care Patient             Patient will benefit from skilled therapeutic intervention in order to improve the following deficits and impairments:  Pain, Increased fascial restricitons, Increased muscle spasms, Decreased mobility, Decreased endurance, Decreased range of motion, Decreased strength, Impaired flexibility  Visit Diagnosis: Chronic bilateral low back pain with bilateral sciatica  Muscle weakness (generalized)  Other symptoms and signs involving the musculoskeletal system     Problem List Patient Active Problem List   Diagnosis Date Noted   Closed right hip fracture ([place] 12/05/2016   Closed comminuted intertrochanteric fracture of right femur ([place] 12/05/2016   Closed intertrochanteric fracture of hip, right, initial encounter ([place]      SLaureen Abrahams PT, DPT 09/25/21 10:07 AM   [place] [address]Phone: [phone]  Fax:  [phone] Name: KKaarin PardyMRN: [id number] of Birth: [birth year]     PHYSICAL THERAPY DISCHARGE SUMMARY  Visits from Start of Care: 4  Current functional level related to goals / functional outcomes: See above   Remaining deficits: See above   Education / Equipment: HEP   Patient agrees to discharge. Patient goals were partially met. Patient is being discharged due to did not respond to therapy.    SLaureen Abrahams PT, DPT 09/25/21 10:07 AM  [facility] [address] [address]Phone: [phone]  Fax:  [phone]

## 2021-10-02 ENCOUNTER — Encounter: Payer: Medicare HMO | Admitting: Physical Therapy

## 2021-10-09 ENCOUNTER — Encounter: Payer: Medicare HMO | Admitting: Physical Therapy

## 2021-10-23 ENCOUNTER — Telehealth: Payer: Self-pay

## 2021-10-23 NOTE — Telephone Encounter (Signed)
NOTES SCANNED TO REFERRAL 

## 2021-10-29 NOTE — Progress Notes (Signed)
Cardiology Office Note:    Date:  10/31/2021   ID:  Carol Lutz, DOB 06/21/1956, MRN [id number]  PCP:  Angelica Chessman, MD   [facility] Providers Cardiologist:  Alverda Skeans, MD Referring MD: Angelica Chessman, MD   Chief Complaint/Reason for Referral: Coronary and aortic calcification  ASSESSMENT:    Type 2 diabetes mellitus without complication, without long-term current use of insulin (HCC)  Hypertension, unspecified type  Hyperlipidemia, unspecified hyperlipidemia type  Coronary artery calcification seen on CAT scan  Aortic atherosclerosis (HCC)  Peripheral arterial disease (HCC)  Bruit of left carotid artery  Dyspnea, unspecified type    PLAN:    In order of problems listed above:  1.  Per primary care provider.  Continue aspirin,, Jardiance, losartan, and Crestor.  Follow-up in 6 months or earlier if needed.   2.  Continue losartan, goal blood pressures less than 130/80 mmHg.  Her blood pressure is above goal today.  If it is above goal the next time she sees a provider and her losartan should be increased. 3.  Continue Crestor. 4.  She is having no signs or symptoms of angina.  Continue aggressive risk factor modification. 5.  Continue aspirin, atorvastatin with goal LDL less than 70, and strict blood pressure control. 6.  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  This is clinically asymptomatic. 7.  She had carotid dopplers done recently; this is being managed by her PCP. 8.  We will obtain echocardiogram to evaluate further though I think this is likely due to her underlying pulmonary disease from longstanding smoking.             Dispo:  No follow-ups on file.     Medication Adjustments/Labs and Tests Ordered: Current medicines are reviewed at length with the patient today.  Concerns regarding medicines are outlined above.   Tests Ordered: No orders of the defined types were placed in this  encounter.   Medication Changes: No orders of the defined types were placed in this encounter.   History of Present Illness:    FOCUSED CARDIOVASCULAR PROBLEM LIST:   1.  Coronary artery calcification seen on lung cancer screening CT involving left main and LAD 2.  Peripheral vascular disease with subclavian calcification seen on lung cancer screening CT 3.  Aortic atherosclerosis 4.  Type 2 diabetes 5.  Hypertension 6.  Hyperlipidemia 7.  50 p-y smoking history, quit 2022   The patient is a 66 y.o. female with the indicated medical history here for recommendations regarding incidentally noted coronary, subclavian, and aortic atherosclerosis with calcification.  Patient had a lung cancer screening CT done elsewhere which is not in our system.  The patient has been doing well.  She denies any exertional angina, presyncope, syncope, signs or symptoms of stroke, severe bleeding or bruising, need for emergency room visits or hospitalizations.  She has noticed some increasing shortness of breath when she exerts herself more than moderately.  She tells me this is about the same as before but may be just a little worse than last year.  She denies any shortness of breath at rest.  She tells me she does not wheeze when she gets short of breath.         Previous Medical History: Past Medical History:  Diagnosis Date   Diabetes mellitus without complication (HCC)    Former smoker    Hypercholesterolemia    Hyperlipemia    Hypertension    Insomnia  Vitamin D deficiency      Current Medications: Current Meds  Medication Sig   aspirin 81 MG EC tablet Take 81 mg by mouth daily.   cholecalciferol (VITAMIN D) 1000 units tablet Take 5,000 Units by mouth daily.   hydrochlorothiazide (HYDRODIURIL) 25 MG tablet Take 25 mg by mouth daily.   ibuprofen (ADVIL,MOTRIN) 200 MG tablet Take 400 mg by mouth every 6 (six) hours as needed for mild pain.   JARDIANCE 10 MG TABS tablet Take 10 mg by  mouth daily.   losartan (COZAAR) 25 MG tablet Take 25 mg by mouth daily.   meloxicam (MOBIC) 15 MG tablet Take 1 tablet (15 mg total) by mouth daily.   metFORMIN (GLUCOPHAGE-XR) 500 MG 24 hr tablet Take 1,000 mg by mouth 2 (two) times daily.    OZEMPIC, 0.25 OR 0.5 MG/DOSE, 2 MG/1.5ML SOPN Inject into the skin once a week.   pravastatin (PRAVACHOL) 20 MG tablet Take 20 mg by mouth daily.   rosuvastatin (CRESTOR) 20 MG tablet Take 20 mg by mouth at bedtime.   zolpidem (AMBIEN) 10 MG tablet Take 10 mg by mouth at bedtime as needed for sleep.     Allergies:    Patient has no known allergies.   Social History:   Social History   Tobacco Use   Smoking status: Never   Smokeless tobacco: Never  Substance Use Topics   Alcohol use: No   Drug use: No     Family Hx: History reviewed. No pertinent family history.   Review of Systems:   Please see the history of present illness.    All other systems reviewed and are negative.     EKGs/Labs/Other Test Reviewed:    EKG: Sinus rhythm  Prior CV studies:  2023 lung cancer screening CT demonstrating left main, LAD, and subclavian calcification with aortic atherosclerosis  Imaging studies that I have independently reviewed today: CT scan  Recent Labs: No results found for requested labs within last 8760 hours.   Recent Lipid Panel No results found for: CHOL, TRIG, HDL, LDLCALC, LDLDIRECT  Risk Assessment/Calculations:          Physical Exam:    VS:  BP 140/70 (BP Location: Left Arm, Patient Position: Sitting, Cuff Size: Normal)    Pulse 63    Ht 5\' 3"  (1.6 m)    Wt 142 lb (64.4 kg)    SpO2 95%    BMI 25.15 kg/m    Wt Readings from Last 3 Encounters:  10/31/21 142 lb (64.4 kg)  12/05/16 138 lb (62.6 kg)    GENERAL:  No apparent distress, AOx3 HEENT:  L carotid bruit, +2 carotid impulses, no scleral icterus CAR: RRR no murmurs, gallops, rubs, or thrills RES:  Clear to auscultation bilaterally ABD:  Soft, nontender,  nondistended, positive bowel sounds x 4 VASC:  +2 radial pulses, +2 carotid pulses, palpable pedal pulses NEURO:  CN 2-12 grossly intact; motor and sensory grossly intact PSYCH:  No active depression or anxiety EXT:  No edema, ecchymosis, or cyanosis  Signed, 12/07/16, MD  10/31/2021 10:27 AM    [facility] [address] [place], [place], [place]  [place] Phone: [phone]; Fax: [phone]   Note:  This document was prepared using Dragon voice recognition software and may include unintentional dictation errors.

## 2021-10-31 ENCOUNTER — Ambulatory Visit: Payer: Medicare HMO | Admitting: Internal Medicine

## 2021-10-31 ENCOUNTER — Encounter: Payer: Self-pay | Admitting: Internal Medicine

## 2021-10-31 ENCOUNTER — Other Ambulatory Visit: Payer: Self-pay

## 2021-10-31 VITALS — BP 140/70 | HR 63 | Ht 63.0 in | Wt 142.0 lb

## 2021-10-31 DIAGNOSIS — R0989 Other specified symptoms and signs involving the circulatory and respiratory systems: Secondary | ICD-10-CM

## 2021-10-31 DIAGNOSIS — E119 Type 2 diabetes mellitus without complications: Secondary | ICD-10-CM | POA: Diagnosis not present

## 2021-10-31 DIAGNOSIS — I700 Atherosclerosis of aorta: Secondary | ICD-10-CM

## 2021-10-31 DIAGNOSIS — R0600 Dyspnea, unspecified: Secondary | ICD-10-CM

## 2021-10-31 DIAGNOSIS — I2510 Atherosclerotic heart disease of native coronary artery without angina pectoris: Secondary | ICD-10-CM | POA: Diagnosis not present

## 2021-10-31 DIAGNOSIS — E785 Hyperlipidemia, unspecified: Secondary | ICD-10-CM

## 2021-10-31 DIAGNOSIS — I10 Essential (primary) hypertension: Secondary | ICD-10-CM

## 2021-10-31 DIAGNOSIS — I739 Peripheral vascular disease, unspecified: Secondary | ICD-10-CM

## 2021-10-31 NOTE — Patient Instructions (Signed)
Medication Instructions:  No changes *If you need a refill on your cardiac medications before your next appointment, please call your pharmacy*   Lab Work: none If you have labs (blood work) drawn today and your tests are completely normal, you will receive your results only by: MyChart Message (if you have MyChart) OR A paper copy in the mail If you have any lab test that is abnormal or we need to change your treatment, we will call you to review the results.   Testing/Procedures: Your physician has requested that you have a carotid duplex. This test is an ultrasound of the carotid arteries in your neck. It looks at blood flow through these arteries that supply the brain with blood. Allow one hour for this exam. There are no restrictions or special instructions.  Your physician has requested that you have an echocardiogram. Echocardiography is a painless test that uses sound waves to create images of your heart. It provides your doctor with information about the size and shape of your heart and how well your hearts chambers and valves are working. This procedure takes approximately one hour. There are no restrictions for this procedure.    Follow-Up: At [facility], you and your health needs are our priority.  As part of our continuing mission to provide you with exceptional heart care, we have created designated Provider Care Teams.  These Care Teams include your primary Cardiologist (physician) and Advanced Practice Providers (APPs -  Physician Assistants and Nurse Practitioners) who all work together to provide you with the care you need, when you need it.  We recommend signing up for the patient portal called "MyChart".  Sign up information is provided on this After Visit Summary.  MyChart is used to connect with patients for Virtual Visits (Telemedicine).  Patients are able to view lab/test results, encounter notes, upcoming appointments, etc.  Non-urgent messages can be sent to your  provider as well.   To learn more about what you can do with MyChart, go to [email or web address].    Your next appointment:   6 month(s)  The format for your next appointment:   In Person  Provider:   Orbie Pyo, MD     Other Instructions

## 2021-11-05 ENCOUNTER — Other Ambulatory Visit: Payer: Self-pay

## 2021-11-05 ENCOUNTER — Encounter: Payer: Self-pay | Admitting: Physical Medicine and Rehabilitation

## 2021-11-05 ENCOUNTER — Ambulatory Visit (INDEPENDENT_AMBULATORY_CARE_PROVIDER_SITE_OTHER): Payer: Medicare HMO | Admitting: Physical Medicine and Rehabilitation

## 2021-11-05 VITALS — BP 96/63 | HR 114

## 2021-11-05 DIAGNOSIS — M4726 Other spondylosis with radiculopathy, lumbar region: Secondary | ICD-10-CM

## 2021-11-05 DIAGNOSIS — M5416 Radiculopathy, lumbar region: Secondary | ICD-10-CM

## 2021-11-05 DIAGNOSIS — M48061 Spinal stenosis, lumbar region without neurogenic claudication: Secondary | ICD-10-CM | POA: Diagnosis not present

## 2021-11-05 NOTE — Progress Notes (Signed)
Carol Lutz - 66 y.o. female MRN [id number]  Date of birth: 1955-10-24  Office Visit Note: Visit Date: 11/05/2021 PCP: Robyne Peers, MD Referred by: Robyne Peers, MD  Subjective: Chief Complaint  Patient presents with   Lower Back - Pain   Right Leg - Pain   Left Leg - Pain   HPI: Carol Lutz is a 66 y.o. female who comes in today at the request of Dr. Jean Rosenthal for evaluation of chronic, worsening and severe bilateral lower back pain radiating to buttocks and down lateral legs to ankles, right greater than left. Patient reports pain has been ongoing for several months. Patient states her pain is exacerbated by laying down, describes pain as constant sharp and sore sensation, currently rates as 8 out of 10. Patient reports some relief of pain with home exercise regimen and medications, patient does take Gabapentin. Patient reports she has attended formal physical therapy in the past with our in house team and also underwent formal chiropractic treatments with Dr. Moshe Salisbury in [place], [place] with some relief of pain with these treatments. Patients lumbar MRI from 2022 exhibits multi-level mild facet hypertrophy, mild spinal canal stenosis at L3-L4 and L4-L5, severe disc height loss and severe left and moderate to severe right foraminal narrowing at L5-S1. No high grade spinal stenosis noted. 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 Patient states she has a trip planned to [place] in May and would like to have injections before she leaves. Patient does have history of right hip fracture in 2018, repair by Dr. Jean Rosenthal. Patient reports no current issues with right hip. Patient denies focal weakness, numbness and tingling. Patient denies recent trauma or falls.   Review of Systems  Musculoskeletal:  Positive for back pain.  Neurological:  Negative for tingling, sensory change, focal weakness and weakness.  All other systems reviewed  and are negative. Otherwise per HPI.  Assessment & Plan: Visit Diagnoses:    ICD-10-CM   1. Lumbar radiculopathy  M54.16 Ambulatory referral to Physical Medicine Rehab    2. Other spondylosis with radiculopathy, lumbar region  M47.26 Ambulatory referral to Physical Medicine Rehab    3. Foraminal stenosis of lumbar region  M48.061 Ambulatory referral to Physical Medicine Rehab    4. Spinal stenosis of lumbar region without neurogenic claudication  M48.061 Ambulatory referral to Physical Medicine Rehab       Plan: Findings:  Chronic, worsening and severe bilateral lower back pain radiating to buttocks and down lateral legs to ankles, right greater than left.  Patient continues to have excruciating debilitating pain despite good conservative therapy such as home exercise regimen, formal physical therapy, chiropractic treatments and medications.  Patient's clinical presentation and exam are consistent with L5 nerve pattern.  We did discuss patient's recent lumbar MRI using images and spine model. We believe the next step is to perform a diagnostic and hopefully therapeutic right L5-S1 interlaminar epidural steroid injection under fluoroscopic guidance.  Patient is not currently undergoing long term anticoagulant therapy.  We did discuss epidural steroid injection procedure with the patient in detail today and she has no further questions at this time.  Patient encouraged to remain active and to continue with chiropractic treatments as tolerated. No red flag symptoms noted upon exam today.  Patient has had physical therapy and continues with physician directed home exercise program.  Current medication management is not beneficial in increasing her functional status. Please note that procedures are done  as part of a comprehensive orthopedic and pain management program with access to in-house orthopedics, spine surgery and physical therapy as well as access to [facility]  counseling if needed.     Meds & Orders: No orders of the defined types were placed in this encounter.   Orders Placed This Encounter  Procedures   Ambulatory referral to Physical Medicine Rehab    Follow-up: Return for Right L5-S1 interlaminar epidural steroid injection.   Procedures: No procedures performed      Clinical History: MRI LUMBAR SPINE WITHOUT CONTRAST   TECHNIQUE: Multiplanar, multisequence MR imaging of the lumbar spine was performed. No intravenous contrast was administered.   COMPARISON:  Radiographs 04/29/2021, no prior MRI   FINDINGS: Segmentation:  Standard.   Alignment:  Physiologic.   Vertebrae: Vertebral body height loss at L1, with mild wedging; no increased signal on STIR sequence to suggest that this is acute. No evidence of discitis or suspicious bone lesion.   Conus medullaris and cauda equina: Conus extends to the L1-L2 level. Conus and cauda equina appear normal.   Paraspinal and other soft tissues: Negative.   Disc levels:   T12-L1: Disc height loss with mild disc bulge. No spinal canal stenosis or neural foraminal narrowing.   L1-L2: Disc height loss with mild disc bulge. Mild facet arthropathy. Minimal spinal canal stenosis. No neural foraminal narrowing.   L2-L3: Disc height loss with broad-based disc bulge. Mild facet arthropathy. Mild spinal canal stenosis. No neural foraminal narrowing.   L3-L4: Broad-based disc bulge. Mild facet arthropathy. Mild spinal canal stenosis. Mild right neural foraminal narrowing.   L4-L5: Disc bulge and superimposed central protrusion. Mild facet arthropathy. Ligamentum flavum hypertrophy. Mild spinal canal stenosis. Moderate bilateral neural foraminal narrowing.   L5-S1: Severe disc height loss with broad-based disc bulge and annular fissure. Mild facet arthropathy. No spinal canal stenosis. Severe left and moderate to severe right foraminal narrowing.   IMPRESSION: 1. L5-S1 severe left  and moderate to severe right neural foraminal narrowing. 2. L4-L5 moderate bilateral neural foraminal narrowing. 3. L3-L4 and L2-L3 mild spinal canal stenosis.     Electronically Signed   By: Merilyn Baba M.D.   On: 05/23/2021 17:46   She reports that she has never smoked. She has never used smokeless tobacco. No results for input(s): HGBA1C, LABURIC in the last 8760 hours.  Objective:  VS:  HT:     WT:    BMI:      BP:96/63   HR:(!) 114bpm   TEMP: ( )   RESP:  Physical Exam Vitals and nursing note reviewed.  HENT:     Head: Normocephalic and atraumatic.     Right Ear: External ear normal.     Left Ear: External ear normal.     Nose: Nose normal.     Mouth/Throat:     Mouth: Mucous membranes are moist.  Eyes:     Extraocular Movements: Extraocular movements intact.  Cardiovascular:     Rate and Rhythm: Normal rate.     Pulses: Normal pulses.  Pulmonary:     Effort: Pulmonary effort is normal.  Abdominal:     General: Abdomen is flat.  Musculoskeletal:        General: Tenderness present.     Cervical back: Normal range of motion.     Comments: Pt rises from seated position to standing without difficulty. Good lumbar range of motion. Strong distal strength without clonus, no pain upon palpation of greater trochanters. Dysesthesias  noted to bilateral L5 dermatomes. Sensation intact bilaterally. Walks independently, gait steady.   Skin:    General: Skin is warm and dry.     Capillary Refill: Capillary refill takes less than 2 seconds.  Neurological:     General: No focal deficit present.     Mental Status: She is alert and oriented to person, place, and time.  Psychiatric:        Mood and Affect: Mood normal.        Behavior: Behavior normal.    Ortho Exam  Imaging: No results found.  Past Medical/Family/Surgical/Social History: Medications & Allergies reviewed per EMR, new medications updated. Patient Active Problem List   Diagnosis Date Noted   Closed right hip  fracture ([place]) 12/05/2016   Closed comminuted intertrochanteric fracture of right femur ([place]) 12/05/2016   Closed intertrochanteric fracture of hip, right, initial encounter [facility])    Past Medical History:  Diagnosis Date   Diabetes mellitus without complication ([place])    Former smoker    Hypercholesterolemia    Hyperlipemia    Hypertension    Insomnia    Vitamin D deficiency    History reviewed. No pertinent family history. Past Surgical History:  Procedure Laterality Date   INTRAMEDULLARY (IM) NAIL INTERTROCHANTERIC Right 12/05/2016   Procedure: ORIF RIGHT HIP FACTURE;  Surgeon: Mcarthur Rossetti, MD;  Location: [facility];  Service: Orthopedics;  Laterality: Right;   Social History   Occupational History   Not on file  Tobacco Use   Smoking status: Never   Smokeless tobacco: Never  Substance and Sexual Activity   Alcohol use: No   Drug use: No   Sexual activity: Not on file

## 2021-11-05 NOTE — Progress Notes (Signed)
Pt state lower back pain the travels to both legs. Pt state laying down makes the pain worse. Pt state she takes pain meds to help ease her pain.  Numeric Pain Rating Scale and Functional Assessment Average Pain 9 Pain Right Now 3 My pain is constant, sharp, stabbing, and aching Pain is worse with: some activites and laying down Pain improves with: rest, heat/ice, and medication   In the last MONTH (on 0-10 scale) has pain interfered with the following?  1. General activity like being  able to carry out your everyday physical activities such as walking, climbing stairs, carrying groceries, or moving a chair?  Rating(7)  2. Relation with others like being able to carry out your usual social activities and roles such as  activities at home, at work and in your community. Rating(8)  3. Enjoyment of life such that you have  been bothered by emotional problems such as feeling anxious, depressed or irritable?  Rating(9)

## 2021-11-14 ENCOUNTER — Other Ambulatory Visit: Payer: Self-pay

## 2021-11-14 ENCOUNTER — Ambulatory Visit (HOSPITAL_COMMUNITY): Payer: Medicare HMO | Attending: Cardiology

## 2021-11-14 DIAGNOSIS — I2510 Atherosclerotic heart disease of native coronary artery without angina pectoris: Secondary | ICD-10-CM | POA: Diagnosis present

## 2021-11-14 DIAGNOSIS — I739 Peripheral vascular disease, unspecified: Secondary | ICD-10-CM | POA: Diagnosis present

## 2021-11-14 DIAGNOSIS — E785 Hyperlipidemia, unspecified: Secondary | ICD-10-CM

## 2021-11-14 DIAGNOSIS — R0989 Other specified symptoms and signs involving the circulatory and respiratory systems: Secondary | ICD-10-CM | POA: Diagnosis present

## 2021-11-14 DIAGNOSIS — I10 Essential (primary) hypertension: Secondary | ICD-10-CM

## 2021-11-14 DIAGNOSIS — I700 Atherosclerosis of aorta: Secondary | ICD-10-CM | POA: Diagnosis present

## 2021-11-14 DIAGNOSIS — E119 Type 2 diabetes mellitus without complications: Secondary | ICD-10-CM

## 2021-11-14 DIAGNOSIS — R0600 Dyspnea, unspecified: Secondary | ICD-10-CM

## 2021-11-14 LAB — ECHOCARDIOGRAM COMPLETE
Area-P 1/2: 2.05 cm2
S' Lateral: 2.2 cm

## 2021-11-18 ENCOUNTER — Telehealth: Payer: Self-pay | Admitting: Physical Medicine and Rehabilitation

## 2021-11-18 NOTE — Telephone Encounter (Signed)
Patient called. Returning a call to schedule with Dr. Newton.  ?

## 2021-11-21 ENCOUNTER — Encounter (HOSPITAL_COMMUNITY): Payer: Medicare HMO

## 2021-12-05 ENCOUNTER — Ambulatory Visit (INDEPENDENT_AMBULATORY_CARE_PROVIDER_SITE_OTHER): Payer: Medicare HMO | Admitting: Physical Medicine and Rehabilitation

## 2021-12-05 ENCOUNTER — Ambulatory Visit: Payer: Self-pay

## 2021-12-05 ENCOUNTER — Other Ambulatory Visit: Payer: Self-pay

## 2021-12-05 ENCOUNTER — Encounter: Payer: Self-pay | Admitting: Physical Medicine and Rehabilitation

## 2021-12-05 VITALS — BP 98/59 | HR 88

## 2021-12-05 DIAGNOSIS — M5416 Radiculopathy, lumbar region: Secondary | ICD-10-CM | POA: Diagnosis not present

## 2021-12-05 MED ORDER — METHYLPREDNISOLONE ACETATE 80 MG/ML IJ SUSP
80.0000 mg | Freq: Once | INTRAMUSCULAR | Status: AC
  Administered 2021-12-05: 80 mg

## 2021-12-05 NOTE — Progress Notes (Signed)
Pt state lower back pain the travels to both legs. Pt state walking and laying down makes the pain worse. Pt state she takes pain meds to help ease her pain. ? ?Numeric Pain Rating Scale and Functional Assessment ?Average Pain 2 ? ? ?In the last MONTH (on 0-10 scale) has pain interfered with the following? ? ?1. General activity like being  able to carry out your everyday physical activities such as walking, climbing stairs, carrying groceries, or moving a chair?  ?Rating(8) ? ? ?+Driver, -BT, -Dye Allergies. ? ?

## 2021-12-05 NOTE — Patient Instructions (Signed)

## 2021-12-15 NOTE — Progress Notes (Signed)
? ?Carol Lutz - 66 y.o. female MRN [id number]  Date of birth: October 28, 1955 ? ?Office Visit Note: ?Visit Date: 12/05/2021 ?PCP: Angelica Chessman, MD ?Referred by: Angelica Chessman, MD ? ?Subjective: ?Chief Complaint  ?Patient presents with  ? Lower Back - Pain  ? Left Leg - Pain  ? Right Leg - Pain  ? ?HPI:  Carol Lutz is a 66 y.o. female who comes in today at the request of Ellin Goodie, FNP for planned Right L5-S1 Lumbar Interlaminar epidural steroid injection with fluoroscopic guidance.  The patient has failed conservative care including home exercise, medications, time and activity modification.  This injection will be diagnostic and hopefully therapeutic.  Please see requesting physician notes for further details and justification. ? ?ROS Otherwise per HPI. ? ?Assessment & Plan: ?Visit Diagnoses:  ?  ICD-10-CM   ?1. Lumbar radiculopathy  M54.16 XR C-ARM NO REPORT  ?  Epidural Steroid injection  ?  methylPREDNISolone acetate (DEPO-MEDROL) injection 80 mg  ?  ?  ?Plan: No additional findings.  ? ?Meds & Orders:  ?Meds ordered this encounter  ?Medications  ? methylPREDNISolone acetate (DEPO-MEDROL) injection 80 mg  ?  ?Orders Placed This Encounter  ?Procedures  ? XR C-ARM NO REPORT  ? Epidural Steroid injection  ?  ?Follow-up: Return for visit to requesting provider as needed.  ? ?Procedures: ?No procedures performed  ?Lumbar Epidural Steroid Injection - Interlaminar Approach with Fluoroscopic Guidance ? ?Patient: Carol Lutz      ?Date of Birth: 03-07-1956 ?MRN: [id number] ?PCP: Angelica Chessman, MD      ?Visit Date: 12/05/2021 ?  ?Universal Protocol:    ? ?Consent Given By: the patient ? ?Position: PRONE ? ?Additional Comments: ?Vital signs were monitored before and after the procedure. ?Patient was prepped and draped in the usual sterile fashion. ?The correct patient, procedure, and site was verified. ? ? ?Injection Procedure Details:  ? ?Procedure diagnoses: Lumbar radiculopathy [M54.16]  ? ?Meds  Administered:  ?Meds ordered this encounter  ?Medications  ? methylPREDNISolone acetate (DEPO-MEDROL) injection 80 mg  ?  ? ?Laterality: Right ? ?Location/Site:  L5-S1 ? ?Needle: 3.5 in., 20 ga. Tuohy ? ?Needle Placement: Paramedian epidural ? ?Findings:  ? -Comments: Excellent flow of contrast into the epidural space. ? ?Procedure Details: ?Using a paramedian approach from the side mentioned above, the region overlying the inferior lamina was localized under fluoroscopic visualization and the soft tissues overlying this structure were infiltrated with 4 ml. of 1% Lidocaine without Epinephrine. The Tuohy needle was inserted into the epidural space using a paramedian approach.  ? ?The epidural space was localized using loss of resistance along with counter oblique bi-planar fluoroscopic views.  After negative aspirate for air, blood, and CSF, a 2 ml. volume of Isovue-250 was injected into the epidural space and the flow of contrast was observed. Radiographs were obtained for documentation purposes.   ? ?The injectate was administered into the level noted above. ? ? ?Additional Comments:  ?The patient tolerated the procedure well ?Dressing: 2 x 2 sterile gauze and Band-Aid ?  ? ?Post-procedure details: ?Patient was observed during the procedure. ?Post-procedure instructions were reviewed. ? ?Patient left the clinic in stable condition.  ? ?Clinical History: ?No specialty comments available.  ? ? ? ?Objective:  VS:  HT:    WT:   BMI:     BP:(!) 98/59  HR:88bpm  TEMP: ( )  RESP:  ?Physical Exam ?Vitals and nursing note reviewed.  ?Constitutional:   ?   General:  She is not in acute distress. ?   Appearance: Normal appearance. She is not ill-appearing.  ?HENT:  ?   Head: Normocephalic and atraumatic.  ?   Right Ear: External ear normal.  ?   Left Ear: External ear normal.  ?Eyes:  ?   Extraocular Movements: Extraocular movements intact.  ?Cardiovascular:  ?   Rate and Rhythm: Normal rate.  ?   Pulses: Normal pulses.   ?Pulmonary:  ?   Effort: Pulmonary effort is normal. No respiratory distress.  ?Abdominal:  ?   General: There is no distension.  ?   Palpations: Abdomen is soft.  ?Musculoskeletal:     ?   General: Tenderness present.  ?   Cervical back: Neck supple.  ?   Right lower leg: No edema.  ?   Left lower leg: No edema.  ?   Comments: Patient has good distal strength with no pain over the greater trochanters.  No clonus or focal weakness.  ?Skin: ?   Findings: No erythema, lesion or rash.  ?Neurological:  ?   General: No focal deficit present.  ?   Mental Status: She is alert and oriented to person, place, and time.  ?   Sensory: No sensory deficit.  ?   Motor: No weakness or abnormal muscle tone.  ?   Coordination: Coordination normal.  ?Psychiatric:     ?   Mood and Affect: Mood normal.     ?   Behavior: Behavior normal.  ?  ? ?Imaging: ?No results found. ?

## 2021-12-15 NOTE — Procedures (Signed)
Lumbar Epidural Steroid Injection - Interlaminar Approach with Fluoroscopic Guidance ? ?Patient: Carol Lutz      ?Date of Birth: 07-06-1956 ?MRN: [id number] ?PCP: Angelica Chessman, MD      ?Visit Date: 12/05/2021 ?  ?Universal Protocol:    ? ?Consent Given By: the patient ? ?Position: PRONE ? ?Additional Comments: ?Vital signs were monitored before and after the procedure. ?Patient was prepped and draped in the usual sterile fashion. ?The correct patient, procedure, and site was verified. ? ? ?Injection Procedure Details:  ? ?Procedure diagnoses: Lumbar radiculopathy [M54.16]  ? ?Meds Administered:  ?Meds ordered this encounter  ?Medications  ? methylPREDNISolone acetate (DEPO-MEDROL) injection 80 mg  ?  ? ?Laterality: Right ? ?Location/Site:  L5-S1 ? ?Needle: 3.5 in., 20 ga. Tuohy ? ?Needle Placement: Paramedian epidural ? ?Findings:  ? -Comments: Excellent flow of contrast into the epidural space. ? ?Procedure Details: ?Using a paramedian approach from the side mentioned above, the region overlying the inferior lamina was localized under fluoroscopic visualization and the soft tissues overlying this structure were infiltrated with 4 ml. of 1% Lidocaine without Epinephrine. The Tuohy needle was inserted into the epidural space using a paramedian approach.  ? ?The epidural space was localized using loss of resistance along with counter oblique bi-planar fluoroscopic views.  After negative aspirate for air, blood, and CSF, a 2 ml. volume of Isovue-250 was injected into the epidural space and the flow of contrast was observed. Radiographs were obtained for documentation purposes.   ? ?The injectate was administered into the level noted above. ? ? ?Additional Comments:  ?The patient tolerated the procedure well ?Dressing: 2 x 2 sterile gauze and Band-Aid ?  ? ?Post-procedure details: ?Patient was observed during the procedure. ?Post-procedure instructions were reviewed. ? ?Patient left the clinic in stable  condition. ?

## 2021-12-25 ENCOUNTER — Telehealth: Payer: Self-pay | Admitting: Physical Medicine and Rehabilitation

## 2021-12-25 ENCOUNTER — Other Ambulatory Visit: Payer: Self-pay | Admitting: Physical Medicine and Rehabilitation

## 2021-12-25 DIAGNOSIS — M5416 Radiculopathy, lumbar region: Secondary | ICD-10-CM

## 2021-12-25 NOTE — Telephone Encounter (Signed)
Spoke with patient this morning, states no relief with recent right L5-S1 interlaminar epidural steroid injection on 12/05/2021. Patient reports bilateral lower back pain radiating down both lateral legs to calf region. Patient states she is going out of town in May and would like to try another injection before she leaves. I will place order for bilateral L5 transforaminal epidural steroid injection.  ?

## 2021-12-25 NOTE — Telephone Encounter (Signed)
Pt called requesting a call back from [place]. Pt states she received injections 2 wks ago and they did not work and was informed to call if injections don't work. Please call pt at [phone] ?

## 2022-01-23 ENCOUNTER — Ambulatory Visit: Payer: Medicare HMO | Admitting: Physical Medicine and Rehabilitation

## 2022-01-23 ENCOUNTER — Ambulatory Visit: Payer: Self-pay

## 2022-01-23 ENCOUNTER — Encounter: Payer: Self-pay | Admitting: Physical Medicine and Rehabilitation

## 2022-01-23 VITALS — BP 136/75 | HR 65

## 2022-01-23 DIAGNOSIS — M5416 Radiculopathy, lumbar region: Secondary | ICD-10-CM | POA: Diagnosis not present

## 2022-01-23 MED ORDER — PREDNISONE 50 MG PO TABS: ORAL_TABLET | ORAL | 0 refills | Status: DC

## 2022-01-23 MED ORDER — METHYLPREDNISOLONE ACETATE 80 MG/ML IJ SUSP
80.0000 mg | Freq: Once | INTRAMUSCULAR | Status: AC
  Administered 2022-01-23: 80 mg

## 2022-01-23 NOTE — Progress Notes (Signed)
Numeric Pain Rating Scale and Functional Assessment Average Pain 1   In the last MONTH (on 0-10 scale) has pain interfered with the following?  1. General activity like being  able to carry out your everyday physical activities such as walking, climbing stairs, carrying groceries, or moving a chair?  Rating(9)   Patient comes in today for back injection. Last injection given 12/05/2021. Did not help at all. Takes Gabapentin and Aspirin. Worse when weightbearing. States she is going on vacation and will be doing a lot of walking.      +Driver, -BT, -Dye Allergies.

## 2022-01-23 NOTE — Patient Instructions (Signed)

## 2022-01-28 ENCOUNTER — Encounter: Payer: Medicare HMO | Admitting: Physical Medicine and Rehabilitation

## 2022-02-09 NOTE — Progress Notes (Signed)
Carol Lutz - 66 y.o. female MRN [id number]  Date of birth: 07-11-1956  Office Visit Note: Visit Date: 01/23/2022 PCP: Angelica Chessman, MD Referred by: Angelica Chessman, MD  Subjective: Chief Complaint  Patient presents with   Lower Back - Pain   HPI:  Carol Lutz is a 66 y.o. female who comes in today at the request of Dr. Doneen Poisson and Ellin Goodie, FNP for planned Bilateral L5-S1 Lumbar Transforaminal epidural steroid injection with fluoroscopic guidance.  The patient has failed conservative care including home exercise, medications, time and activity modification.  This injection will be diagnostic and hopefully therapeutic.  Please see requesting physician notes for further details and justification.  ROS Otherwise per HPI.  Assessment & Plan: Visit Diagnoses:    ICD-10-CM   1. Lumbar radiculopathy  M54.16 XR C-ARM NO REPORT    Epidural Steroid injection    methylPREDNISolone acetate (DEPO-MEDROL) injection 80 mg      Plan: No additional findings.   Meds & Orders:  Meds ordered this encounter  Medications   methylPREDNISolone acetate (DEPO-MEDROL) injection 80 mg   predniSONE (DELTASONE) 50 MG tablet    Sig: Take 1 tablet daily with food for 5 days until finished    Dispense:  5 tablet    Refill:  0    Orders Placed This Encounter  Procedures   XR C-ARM NO REPORT   Epidural Steroid injection    Follow-up: Return if symptoms worsen or fail to improve.   Procedures: No procedures performed  Lumbosacral Transforaminal Epidural Steroid Injection - Sub-Pedicular Approach with Fluoroscopic Guidance  Patient: Carol Lutz      Date of Birth: Apr 02, 1956 MRN: [id number] PCP: Angelica Chessman, MD      Visit Date: 01/23/2022   Universal Protocol:    Date/Time: 01/23/2022  Consent Given By: the patient  Position: PRONE  Additional Comments: Vital signs were monitored before and after the procedure. Patient was prepped and draped in the usual  sterile fashion. The correct patient, procedure, and site was verified.   Injection Procedure Details:   Procedure diagnoses: Lumbar radiculopathy [M54.16]    Meds Administered:  Meds ordered this encounter  Medications   methylPREDNISolone acetate (DEPO-MEDROL) injection 80 mg   predniSONE (DELTASONE) 50 MG tablet    Sig: Take 1 tablet daily with food for 5 days until finished    Dispense:  5 tablet    Refill:  0    Laterality: Bilateral  Location/Site: L5  Needle:5.0 in., 22 ga.  Short bevel or Quincke spinal needle  Needle Placement: Transforaminal  Findings:    -Comments: Excellent flow of contrast along the nerve, nerve root and into the epidural space.  Procedure Details: After squaring off the end-plates to get a true AP view, the C-arm was positioned so that an oblique view of the foramen as noted above was visualized. The target area is just inferior to the "nose of the scotty dog" or sub pedicular. The soft tissues overlying this structure were infiltrated with 2-3 ml. of 1% Lidocaine without Epinephrine.  The spinal needle was inserted toward the target using a "trajectory" view along the fluoroscope beam.  Under AP and lateral visualization, the needle was advanced so it did not puncture dura and was located close the 6 O'Clock position of the pedical in AP tracterory. Biplanar projections were used to confirm position. Aspiration was confirmed to be negative for CSF and/or blood. A 1-2 ml. volume of Isovue-250 was injected and flow of  contrast was noted at each level. Radiographs were obtained for documentation purposes.   After attaining the desired flow of contrast documented above, a 0.5 to 1.0 ml test dose of 0.25% Marcaine was injected into each respective transforaminal space.  The patient was observed for 90 seconds post injection.  After no sensory deficits were reported, and normal lower extremity motor function was noted,   the above injectate was  administered so that equal amounts of the injectate were placed at each foramen (level) into the transforaminal epidural space.   Additional Comments:  No complications occurred Dressing: 2 x 2 sterile gauze and Band-Aid    Post-procedure details: Patient was observed during the procedure. Post-procedure instructions were reviewed.  Patient left the clinic in stable condition.    Clinical History: No specialty comments available.     Objective:  VS:  HT:    WT:   BMI:     BP:136/75  HR:65bpm  TEMP: ( )  RESP:  Physical Exam Vitals and nursing note reviewed.  Constitutional:      General: She is not in acute distress.    Appearance: Normal appearance. She is not ill-appearing.  HENT:     Head: Normocephalic and atraumatic.     Right Ear: External ear normal.     Left Ear: External ear normal.  Eyes:     Extraocular Movements: Extraocular movements intact.  Cardiovascular:     Rate and Rhythm: Normal rate.     Pulses: Normal pulses.  Pulmonary:     Effort: Pulmonary effort is normal. No respiratory distress.  Abdominal:     General: There is no distension.     Palpations: Abdomen is soft.  Musculoskeletal:        General: Tenderness present.     Cervical back: Neck supple.     Right lower leg: No edema.     Left lower leg: No edema.     Comments: Patient has good distal strength with no pain over the greater trochanters.  No clonus or focal weakness.  Skin:    Findings: No erythema, lesion or rash.  Neurological:     General: No focal deficit present.     Mental Status: She is alert and oriented to person, place, and time.     Sensory: No sensory deficit.     Motor: No weakness or abnormal muscle tone.     Coordination: Coordination normal.  Psychiatric:        Mood and Affect: Mood normal.        Behavior: Behavior normal.     Imaging: No results found.

## 2022-02-09 NOTE — Procedures (Signed)
Lumbosacral Transforaminal Epidural Steroid Injection - Sub-Pedicular Approach with Fluoroscopic Guidance  Patient: Carol Lutz      Date of Birth: [birth year] MRN: [id number] PCP: Robyne Peers, MD      Visit Date: 01/23/2022   Universal Protocol:    Date/Time: 01/23/2022  Consent Given By: the patient  Position: PRONE  Additional Comments: Vital signs were monitored before and after the procedure. Patient was prepped and draped in the usual sterile fashion. The correct patient, procedure, and site was verified.   Injection Procedure Details:   Procedure diagnoses: Lumbar radiculopathy [M54.16]    Meds Administered:  Meds ordered this encounter  Medications   methylPREDNISolone acetate (DEPO-MEDROL) injection 80 mg   predniSONE (DELTASONE) 50 MG tablet    Sig: Take 1 tablet daily with food for 5 days until finished    Dispense:  5 tablet    Refill:  0    Laterality: Bilateral  Location/Site: L5  Needle:5.0 in., 22 ga.  Short bevel or Quincke spinal needle  Needle Placement: Transforaminal  Findings:    -Comments: Excellent flow of contrast along the nerve, nerve root and into the epidural space.  Procedure Details: After squaring off the end-plates to get a true AP view, the C-arm was positioned so that an oblique view of the foramen as noted above was visualized. The target area is just inferior to the "nose of the scotty dog" or sub pedicular. The soft tissues overlying this structure were infiltrated with 2-3 ml. of 1% Lidocaine without Epinephrine.  The spinal needle was inserted toward the target using a "trajectory" view along the fluoroscope beam.  Under AP and lateral visualization, the needle was advanced so it did not puncture dura and was located close the 6 O'Clock position of the pedical in AP tracterory. Biplanar projections were used to confirm position. Aspiration was confirmed to be negative for CSF and/or blood. A 1-2 ml. volume of Isovue-250  was injected and flow of contrast was noted at each level. Radiographs were obtained for documentation purposes.   After attaining the desired flow of contrast documented above, a 0.5 to 1.0 ml test dose of 0.25% Marcaine was injected into each respective transforaminal space.  The patient was observed for 90 seconds post injection.  After no sensory deficits were reported, and normal lower extremity motor function was noted,   the above injectate was administered so that equal amounts of the injectate were placed at each foramen (level) into the transforaminal epidural space.   Additional Comments:  No complications occurred Dressing: 2 x 2 sterile gauze and Band-Aid    Post-procedure details: Patient was observed during the procedure. Post-procedure instructions were reviewed.  Patient left the clinic in stable condition.

## 2022-05-06 NOTE — Progress Notes (Signed)
Cardiology Office Note:    Date:  05/09/2022   ID:  Carol Lutz, DOB 1956/07/14, MRN [id number]  PCP:  Angelica Chessman, MD   [facility] Providers Cardiologist:  Alverda Skeans, MD Referring MD: Angelica Chessman, MD   Chief Complaint/Reason for Referral: Coronary and aortic calcification  ASSESSMENT:    Type 2 diabetes mellitus without complication, without long-term current use of insulin (HCC)  Hypertension associated with diabetes (HCC)  Hyperlipidemia associated with type 2 diabetes mellitus (HCC) - Plan: VAS [place] AAA DUPLEX, Lipid panel, Hepatic function panel, Lipoprotein A (LPA)  Coronary artery calcification seen on CAT scan - Plan: VAS [place] AAA DUPLEX, Lipid panel, Hepatic function panel, Lipoprotein A (LPA)  Aortic atherosclerosis (HCC) - Plan: Lipid panel, Hepatic function panel, Lipoprotein A (LPA)  Peripheral arterial disease (HCC)  Bruit of left carotid artery - Plan: VAS US CAROTID   PLAN:    In order of problems listed above:  1.  Type 2 diabetes: Continue aspirin, Jardiance, losartan, and Crestor. 2.  Hypertension: Blood pressures well controlled on her current regimen.   3.  Hyperlipidemia: We will check lipid panel, LFTs, LP(a) today.  Her medication list shows both pravastatin and rosuvastatin; will make sure she is on rosuvastatin 20 mg only. 4.  Coronary artery calcification: Continue aspirin, statin, and strict blood pressure control. 5.  Aortic atherosclerosis: Continue aspirin, statin, and strict blood pressure control. 6.  Peripheral arterial disease: Continue aspirin, statin, and strict blood pressure control.  Will obtain abdominal ultrasound for screening given history of smoking. 7.  Carotid bruit: Not obtained previously; we will obtain carotid ultrasound to evaluate further.   Dispo:  Return in about 9 months (around 02/07/2023).     Medication Adjustments/Labs and Tests Ordered: Current medicines are reviewed at length with the  patient today.  Concerns regarding medicines are outlined above.   Tests Ordered: Orders Placed This Encounter  Procedures   Lipid panel   Hepatic function panel   Lipoprotein A (LPA)   VAS US CAROTID   VAS [place] AAA DUPLEX    Medication Changes: No orders of the defined types were placed in this encounter.   History of Present Illness:    FOCUSED CARDIOVASCULAR PROBLEM LIST:   1.  Coronary artery calcification seen on lung cancer screening CT involving left main and LAD 2.  Peripheral vascular disease with subclavian calcification seen on lung cancer screening CT 3.  Aortic atherosclerosis 4.  Type 2 diabetes not on insulin 5.  Hypertension 6.  Hyperlipidemia 7.  50 p-y smoking history, quit 2022 8.  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  She is doing well was asymptomatic.  Her medical therapy was continued and she was referred for carotid Dopplers and echocardiogram.  Today: The patient continues to do well.  She went to [place] in May and had a very uneventful time.  She occasionally gets short of breath and this is associated with wheezing.  She uses albuterol maybe once or twice a month.  She denies any exertional angina, concerning or frequent exertional dyspnea, presyncope, syncope, palpitations, paroxysmal nocturnal dyspnea, orthopnea.  She has had no signs or symptoms of stroke.  She is otherwise well and without complaints.  She has been compliant with her medical therapy.         Current Medications: Current Meds  Medication Sig   aspirin 81 MG EC tablet Take 81  mg by mouth daily.   chlorhexidine (PERIDEX) 0.12 % solution By Mouth   cholecalciferol (VITAMIN D) 1000 units tablet Take 5,000 Units by mouth daily.   gabapentin (NEURONTIN) 100 MG capsule Take 100 mg by mouth 2 (two) times daily.   hydrochlorothiazide (HYDRODIURIL) 25 MG tablet Take  12 mg by mouth daily.   ibuprofen (ADVIL,MOTRIN) 200 MG tablet Take 400 mg by mouth every 6 (six) hours as needed for mild pain.   JARDIANCE 10 MG TABS tablet Take 10 mg by mouth daily.   losartan (COZAAR) 25 MG tablet Take 25 mg by mouth daily.   metFORMIN (GLUCOPHAGE-XR) 500 MG 24 hr tablet Take 1,000 mg by mouth 2 (two) times daily.    pravastatin (PRAVACHOL) 20 MG tablet Take 20 mg by mouth daily.   rosuvastatin (CRESTOR) 20 MG tablet Take 20 mg by mouth at bedtime.   tretinoin (RETIN-A) 0.05 % cream sparingly Topical Every Night PRN   zolpidem (AMBIEN) 10 MG tablet Take 10 mg by mouth at bedtime as needed for sleep.     Allergies:    Patient has no known allergies.   Social History:   Social History   Tobacco Use   Smoking status: Never   Smokeless tobacco: Never  Substance Use Topics   Alcohol use: No   Drug use: No     Family Hx: History reviewed. No pertinent family history.   Review of Systems:   Please see the history of present illness.    All other systems reviewed and are negative.     EKGs/Labs/Other Test Reviewed:    EKG: Sinus rhythm  Prior CV studies:  2023 TTE ejection fraction is 65 to 70% with mild calcifications of the aortic valve and no significant valvular heart with grade 1 diastolic dysfunction  2023 lung cancer screening CT demonstrating left main, LAD, and subclavian calcification with aortic atherosclerosis  Imaging studies that I have independently reviewed today: CT scan  Recent Labs: No results found for requested labs within last 365 days.   Recent Lipid Panel No results found for: "CHOL", "TRIG", "HDL", "LDLCALC", "LDLDIRECT"  Risk Assessment/Calculations:          Physical Exam:    VS:  BP 138/60   Pulse (!) 109   Ht 5\' 3"  (1.6 m)   Wt 125 lb 3.2 oz (56.8 kg)   SpO2 96%   BMI 22.18 kg/m    Wt Readings from Last 3 Encounters:  05/09/22 125 lb 3.2 oz (56.8 kg)  10/31/21 142 lb (64.4 kg)  12/05/16 138 lb (62.6 kg)     GENERAL:  No apparent distress, AOx3 HEENT:  L carotid bruit, +2 carotid impulses, no scleral icterus CAR: RRR no murmurs, gallops, rubs, or thrills RES:  Clear to auscultation bilaterally ABD:  Soft, nontender, nondistended, positive bowel sounds x 4 VASC:  +2 radial pulses, +2 carotid pulses, palpable pedal pulses NEURO:  CN 2-12 grossly intact; motor and sensory grossly intact PSYCH:  No active depression or anxiety EXT:  No edema, ecchymosis, or cyanosis  Signed, 12/07/16, MD  05/09/2022 2:52 PM    [facility] [address] [place], [place], [place]  [place] Phone: [phone]; Fax: [phone]   Note:  This document was prepared using Dragon voice recognition software and may include unintentional dictation errors.

## 2022-05-09 ENCOUNTER — Ambulatory Visit: Payer: Medicare HMO | Admitting: Internal Medicine

## 2022-05-09 ENCOUNTER — Encounter: Payer: Self-pay | Admitting: Internal Medicine

## 2022-05-09 VITALS — BP 138/60 | HR 109 | Ht 63.0 in | Wt 125.2 lb

## 2022-05-09 DIAGNOSIS — I152 Hypertension secondary to endocrine disorders: Secondary | ICD-10-CM

## 2022-05-09 DIAGNOSIS — E119 Type 2 diabetes mellitus without complications: Secondary | ICD-10-CM

## 2022-05-09 DIAGNOSIS — E785 Hyperlipidemia, unspecified: Secondary | ICD-10-CM

## 2022-05-09 DIAGNOSIS — E1169 Type 2 diabetes mellitus with other specified complication: Secondary | ICD-10-CM

## 2022-05-09 DIAGNOSIS — I700 Atherosclerosis of aorta: Secondary | ICD-10-CM

## 2022-05-09 DIAGNOSIS — E1159 Type 2 diabetes mellitus with other circulatory complications: Secondary | ICD-10-CM

## 2022-05-09 DIAGNOSIS — R0989 Other specified symptoms and signs involving the circulatory and respiratory systems: Secondary | ICD-10-CM

## 2022-05-09 DIAGNOSIS — I2510 Atherosclerotic heart disease of native coronary artery without angina pectoris: Secondary | ICD-10-CM | POA: Diagnosis not present

## 2022-05-09 DIAGNOSIS — I739 Peripheral vascular disease, unspecified: Secondary | ICD-10-CM

## 2022-05-09 NOTE — Patient Instructions (Signed)
Medication Instructions:  MAKE SURE JUST TAKING ROSUVASTATIN  *If you need a refill on your cardiac medications before your next appointment, please call your pharmacy*   Lab Work: LIPID LIVER AND LIPOPROTIEN If you have labs (blood work) drawn today and your tests are completely normal, you will receive your results only by: MyChart Message (if you have MyChart) OR A paper copy in the mail If you have any lab test that is abnormal or we need to change your treatment, we will call you to review the results.   Testing/Procedures: Your physician has requested that you have a carotid duplex. This test is an ultrasound of the carotid arteries in your neck. It looks at blood flow through these arteries that supply the brain with blood. Allow one hour for this exam. There are no restrictions or special instructions. Your physician has requested that you have an abdominal aorta duplex. During this test, an ultrasound is used to evaluate the aorta. Allow 30 minutes for this exam. Do not eat after midnight the day before and avoid carbonated beverages    Follow-Up: At [facility], you and your health needs are our priority.  As part of our continuing mission to provide you with exceptional heart care, we have created designated Provider Care Teams.  These Care Teams include your primary Cardiologist (physician) and Advanced Practice Providers (APPs -  Physician Assistants and Nurse Practitioners) who all work together to provide you with the care you need, when you need it.  We recommend signing up for the patient portal called "MyChart".  Sign up information is provided on this After Visit Summary.  MyChart is used to connect with patients for Virtual Visits (Telemedicine).  Patients are able to view lab/test results, encounter notes, upcoming appointments, etc.  Non-urgent messages can be sent to your provider as well.   To learn more about what you can do with MyChart, go to  [email or web address].    Your next appointment:   9 month(s)  The format for your next appointment:   In Person  Provider:   Orbie Pyo, MD       Other Instructions NONE  Important Information About Sugar

## 2022-05-09 NOTE — Addendum Note (Signed)
Addended by: Scherrie Bateman E on: 05/09/2022 02:56 PM   Modules accepted: Orders

## 2022-05-10 LAB — HEPATIC FUNCTION PANEL
ALT: 19 IU/L (ref 0–32)
AST: 22 IU/L (ref 0–40)
Albumin: 4.5 g/dL (ref 3.9–4.9)
Alkaline Phosphatase: 97 IU/L (ref 44–121)
Bilirubin Total: 0.4 mg/dL (ref 0.0–1.2)
Bilirubin, Direct: 0.13 mg/dL (ref 0.00–0.40)
Total Protein: 7.3 g/dL (ref 6.0–8.5)

## 2022-05-10 LAB — LIPID PANEL
Chol/HDL Ratio: 3.9 ratio (ref 0.0–4.4)
Cholesterol, Total: 180 mg/dL (ref 100–199)
HDL: 46 mg/dL (ref >39)
LDL Chol Calc (NIH): 96 mg/dL (ref 0–99)
Triglycerides: 221 mg/dL — ABNORMAL HIGH (ref 0–149)
VLDL Cholesterol Cal: 38 mg/dL (ref 5–40)

## 2022-05-10 LAB — LIPOPROTEIN A (LPA): Lipoprotein (a): 37 nmol/L (ref <75.0)

## 2022-05-13 ENCOUNTER — Telehealth: Payer: Self-pay | Admitting: Internal Medicine

## 2022-05-13 ENCOUNTER — Telehealth: Payer: Self-pay | Admitting: *Deleted

## 2022-05-13 ENCOUNTER — Other Ambulatory Visit: Payer: Self-pay | Admitting: Internal Medicine

## 2022-05-13 DIAGNOSIS — I700 Atherosclerosis of aorta: Secondary | ICD-10-CM

## 2022-05-13 DIAGNOSIS — E1169 Type 2 diabetes mellitus with other specified complication: Secondary | ICD-10-CM

## 2022-05-13 DIAGNOSIS — I2510 Atherosclerotic heart disease of native coronary artery without angina pectoris: Secondary | ICD-10-CM

## 2022-05-13 MED ORDER — ROSUVASTATIN CALCIUM 40 MG PO TABS: 40.0000 mg | ORAL_TABLET | Freq: Every day | ORAL | 3 refills | Status: DC

## 2022-05-13 NOTE — Telephone Encounter (Signed)
Spoke w patient.  She will increase crestor to 40 mg daily and return on 10/31 for labs.  Appointment made.

## 2022-05-13 NOTE — Telephone Encounter (Signed)
Pt is returning call. 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

## 2022-05-13 NOTE — Telephone Encounter (Signed)
-----   Message from Orbie Pyo, MD sent at 05/12/2022  6:51 AM EDT ----- Lets increase crestor to 40 since her LDL is >70, repeat FLP/LFTs in 2 months after increase

## 2022-05-23 ENCOUNTER — Ambulatory Visit (HOSPITAL_BASED_OUTPATIENT_CLINIC_OR_DEPARTMENT_OTHER)
Admission: RE | Admit: 2022-05-23 | Discharge: 2022-05-23 | Disposition: A | Discharge status: Home / Self Care | Payer: Medicare HMO | Source: Ambulatory Visit | Attending: Internal Medicine | Admitting: Internal Medicine

## 2022-05-23 ENCOUNTER — Ambulatory Visit (HOSPITAL_COMMUNITY)
Admission: RE | Admit: 2022-05-23 | Discharge: 2022-05-23 | Disposition: A | Discharge status: Home / Self Care | Payer: Medicare HMO | Source: Ambulatory Visit | Attending: Cardiovascular Disease | Admitting: Cardiovascular Disease

## 2022-05-23 DIAGNOSIS — I700 Atherosclerosis of aorta: Secondary | ICD-10-CM | POA: Diagnosis not present

## 2022-05-23 DIAGNOSIS — R0989 Other specified symptoms and signs involving the circulatory and respiratory systems: Secondary | ICD-10-CM

## 2022-05-23 DIAGNOSIS — I2510 Atherosclerotic heart disease of native coronary artery without angina pectoris: Secondary | ICD-10-CM | POA: Diagnosis not present

## 2022-07-15 ENCOUNTER — Other Ambulatory Visit: Payer: Medicare HMO

## 2022-07-17 ENCOUNTER — Other Ambulatory Visit: Payer: Medicare HMO

## 2022-07-22 ENCOUNTER — Ambulatory Visit: Payer: Medicare HMO | Attending: Internal Medicine

## 2022-07-22 DIAGNOSIS — E1169 Type 2 diabetes mellitus with other specified complication: Secondary | ICD-10-CM

## 2022-07-23 LAB — HEPATIC FUNCTION PANEL
ALT: 19 IU/L (ref 0–32)
AST: 23 IU/L (ref 0–40)
Albumin: 4.3 g/dL (ref 3.9–4.9)
Alkaline Phosphatase: 84 IU/L (ref 44–121)
Bilirubin Total: 0.6 mg/dL (ref 0.0–1.2)
Bilirubin, Direct: 0.21 mg/dL (ref 0.00–0.40)
Total Protein: 7.1 g/dL (ref 6.0–8.5)

## 2022-07-23 LAB — LIPID PANEL
Chol/HDL Ratio: 2 ratio (ref 0.0–4.4)
Cholesterol, Total: 90 mg/dL — ABNORMAL LOW (ref 100–199)
HDL: 46 mg/dL (ref >39)
LDL Chol Calc (NIH): 23 mg/dL (ref 0–99)
Triglycerides: 115 mg/dL (ref 0–149)
VLDL Cholesterol Cal: 21 mg/dL (ref 5–40)

## 2023-02-02 NOTE — Progress Notes (Unsigned)
Cardiology Office Note:    Date:  02/03/2023   ID:  Carol Lutz, DOB [birth year], MRN [id number]  PCP:  Angelica Chessman, MD   [facility] Providers Cardiologist:  Alverda Skeans, MD Referring MD: Angelica Chessman, MD   Chief Complaint/Reason for Referral: Coronary and aortic calcification  ASSESSMENT:    Type 2 diabetes mellitus without complication, without long-term current use of insulin (HCC)  Hypertension associated with diabetes (HCC)  Hyperlipidemia associated with type 2 diabetes mellitus (HCC)  Coronary artery calcification seen on CAT scan  Atherosclerosis of aorta (HCC)  Peripheral arterial disease (HCC) - Plan: VAS [place] ABI WITH/WO TBI, VAS [place] LOWER EXTREMITY ARTERIAL DUPLEX  Bilateral carotid artery stenosis  Tobacco abuse - Plan: VAS [place] ABI WITH/WO TBI, VAS [place] LOWER EXTREMITY ARTERIAL DUPLEX  Stage 3a chronic kidney disease (HCC)   PLAN:    In order of problems listed above:  1.  Type 2 diabetes: Continue aspirin, Jardiance, losartan, Ozempic, and Crestor. 2.  Hypertension: BP is well controlled today. 3.  Hyperlipidemia: Her LDL in February was at goal.   4.  Coronary artery calcification: Continue aspirin, statin, and strict blood pressure control. 5.  Aortic atherosclerosis: Continue aspirin, statin, and strict blood pressure control. 6.  Peripheral arterial disease: Calcification of the subclavian artery seen on lung cancer screening CT.  Abdominal ultrasound negative for AAA.  Will obtain ABIs given her smoking history. 7.  Carotid disease: Left bruit noted previously and disease on on recent carotid ultrasound was not significant; continue aspirin, statin, and strict blood pressure control. 8.  Tobacco abuse: Will obtain ABIs to screen for important peripheral vascular disease. 9.  CKD III:  Continue losartan and Jardiance.  Dispo:  Return in about 1 year (around 02/03/2024).     Medication Adjustments/Labs and Tests Ordered: Current  medicines are reviewed at length with the patient today.  Concerns regarding medicines are outlined above.   Tests Ordered: Orders Placed This Encounter  Procedures   VAS [place] ABI WITH/WO TBI   VAS [place] LOWER EXTREMITY ARTERIAL DUPLEX    Medication Changes: No orders of the defined types were placed in this encounter.   History of Present Illness:    FOCUSED CARDIOVASCULAR PROBLEM LIST:   1.  Coronary artery calcification seen on lung cancer screening CT involving left main and LAD 2.  Peripheral vascular disease with subclavian calcification seen on lung cancer screening CT 3.  Aortic atherosclerosis 4.  Type 2 diabetes not on insulin 5.  Hypertension 6.  Hyperlipidemia: Low LP(a) 7.  50 p-y smoking history, quit 2022 8.  CKD stage 3 9.  Mild carotid disease on ultrasound 2023  February 2023: Patient seen for initial consultation regarding incidentally noted coronary, subclavian, and aortic atherosclerosis with calcification.  She is doing well was asymptomatic.  Her medical therapy was continued and she was referred for carotid Dopplers and echocardiogram.  August 2023: The patient continues to do well.  She went to [place] in May and had a very uneventful time.  She occasionally gets short of breath and this is associated with wheezing.  She uses albuterol maybe once or twice a month.  She denies any exertional angina, concerning or frequent exertional dyspnea, presyncope, syncope, palpitations, paroxysmal nocturnal dyspnea, orthopnea.  She has had no signs or symptoms of stroke.  She is otherwise well and without complaints.  She has been compliant with her medical therapy.  Plan: Check lipid panel, LFTs, LP(a); obtain abdominal ultrasound and carotid ultrasound.  Today: In the interim the patient had an abdominal ultrasound and carotid ultrasound which were reassuring.  Her Crestor was increased to 40 mg due to an LDL of 96.  Her LDL in February was 41.  She is doing very well.  She uses  an electric bike but still feels she needs to pedal quite a bit and she does not develop any chest pain or shortness of breath.  She occasionally gets leg fatigue and leg cramping sometimes with exertion.  She denies any need for hospitalizations or emergency room visits.          Current Medications: No outpatient medications have been marked as taking for the 02/03/23 encounter (Office Visit) with Orbie Pyo, MD.     Allergies:    Patient has no known allergies.   Social History:   Social History   Tobacco Use   Smoking status: Never   Smokeless tobacco: Never  Substance Use Topics   Alcohol use: No   Drug use: No     Family Hx: History reviewed. No pertinent family history.   Review of Systems:   Please see the history of present illness.    All other systems reviewed and are negative.     EKGs/Labs/Other Test Reviewed:    EKG: EKG performed today that I personally reviewed demonstrates sinus rhythm with septal infarction pattern.  Prior CV studies:  2023 TTE ejection fraction is 65 to 70% with mild calcifications of the aortic valve and no significant valvular heart with grade 1 diastolic dysfunction  2023 lung cancer screening CT demonstrating left main, LAD, and subclavian calcification with aortic atherosclerosis  Imaging studies that I have independently reviewed today: CT scan  Recent Labs: 07/22/2022: ALT 19   Recent Lipid Panel Lab Results  Component Value Date/Time   CHOL 90 (L) 07/22/2022 10:28 AM   TRIG 115 07/22/2022 10:28 AM   HDL 46 07/22/2022 10:28 AM   LDLCALC 23 07/22/2022 10:28 AM    Risk Assessment/Calculations:          Physical Exam:    VS:  BP 126/72   Pulse 71   Ht 5\' 3"  (1.6 m)   Wt 132 lb 3.2 oz (60 kg)   SpO2 95%   BMI 23.42 kg/m    Wt Readings from Last 3 Encounters:  02/03/23 132 lb 3.2 oz (60 kg)  05/09/22 125 lb 3.2 oz (56.8 kg)  10/31/21 142 lb (64.4 kg)    GENERAL:  No apparent distress, AOx3 HEENT:  L  carotid bruit, +2 carotid impulses, no scleral icterus CAR: RRR no murmurs, gallops, rubs, or thrills RES:  Clear to auscultation bilaterally ABD:  Soft, nontender, nondistended, positive bowel sounds x 4 VASC:  +2 radial pulses, +2 carotid pulses, palpable pedal pulses NEURO:  CN 2-12 grossly intact; motor and sensory grossly intact PSYCH:  No active depression or anxiety EXT:  No edema, ecchymosis, or cyanosis  Signed, Orbie Pyo, MD  02/03/2023 2:04 PM    [facility] [address] [place], [place], [place]  [address] Phone: [phone]; Fax: [phone]   Note:  This document was prepared using Dragon voice recognition software and may include unintentional dictation errors.

## 2023-02-03 ENCOUNTER — Ambulatory Visit: Payer: Medicare HMO | Attending: Internal Medicine | Admitting: Internal Medicine

## 2023-02-03 ENCOUNTER — Encounter: Payer: Self-pay | Admitting: Internal Medicine

## 2023-02-03 VITALS — BP 126/72 | HR 71 | Ht 63.0 in | Wt 132.2 lb

## 2023-02-03 DIAGNOSIS — E119 Type 2 diabetes mellitus without complications: Secondary | ICD-10-CM | POA: Diagnosis not present

## 2023-02-03 DIAGNOSIS — Z7985 Long-term (current) use of injectable non-insulin antidiabetic drugs: Secondary | ICD-10-CM | POA: Diagnosis not present

## 2023-02-03 DIAGNOSIS — I6523 Occlusion and stenosis of bilateral carotid arteries: Secondary | ICD-10-CM

## 2023-02-03 DIAGNOSIS — I739 Peripheral vascular disease, unspecified: Secondary | ICD-10-CM

## 2023-02-03 DIAGNOSIS — Z794 Long term (current) use of insulin: Secondary | ICD-10-CM

## 2023-02-03 DIAGNOSIS — E1159 Type 2 diabetes mellitus with other circulatory complications: Secondary | ICD-10-CM

## 2023-02-03 DIAGNOSIS — I152 Hypertension secondary to endocrine disorders: Secondary | ICD-10-CM

## 2023-02-03 DIAGNOSIS — Z7984 Long term (current) use of oral hypoglycemic drugs: Secondary | ICD-10-CM

## 2023-02-03 DIAGNOSIS — N1831 Chronic kidney disease, stage 3a: Secondary | ICD-10-CM

## 2023-02-03 DIAGNOSIS — Z720 Tobacco use: Secondary | ICD-10-CM

## 2023-02-03 DIAGNOSIS — E785 Hyperlipidemia, unspecified: Secondary | ICD-10-CM

## 2023-02-03 DIAGNOSIS — I700 Atherosclerosis of aorta: Secondary | ICD-10-CM

## 2023-02-03 DIAGNOSIS — I2510 Atherosclerotic heart disease of native coronary artery without angina pectoris: Secondary | ICD-10-CM

## 2023-02-03 DIAGNOSIS — E1169 Type 2 diabetes mellitus with other specified complication: Secondary | ICD-10-CM | POA: Diagnosis not present

## 2023-02-03 NOTE — Patient Instructions (Addendum)
Medication Instructions:  Your physician recommends that you continue on your current medications as directed. Please refer to the Current Medication list given to you today.  *If you need a refill on your cardiac medications before your next appointment, please call your pharmacy*  Testing/Procedures: Your physician has requested that you have a lower extremity arterial duplex. This test is an ultrasound of the arteries in the legs. It looks at arterial blood flow in the legs. Allow one hour for Lower Arterial scans. There are no restrictions or special instructions Your physician has requested that you have an ankle brachial index (ABI). During this test an ultrasound and blood pressure cuff are used to evaluate the arteries that supply the arms and legs with blood. Allow thirty minutes for this exam. There are no restrictions or special instructions.  Follow-Up: At [facility], you and your health needs are our priority.  As part of our continuing mission to provide you with exceptional heart care, we have created designated Provider Care Teams.  These Care Teams include your primary Cardiologist (physician) and Advanced Practice Providers (APPs -  Physician Assistants and Nurse Practitioners) who all work together to provide you with the care you need, when you need it.  Your next appointment:   1 year(s)  Provider:   Orbie Pyo, MD  or APP

## 2023-02-04 NOTE — Addendum Note (Signed)
Addended by: Eden Lathe on: 02/04/2023 01:45 PM   Modules accepted: Orders

## 2023-02-25 ENCOUNTER — Ambulatory Visit (HOSPITAL_COMMUNITY)
Admission: RE | Admit: 2023-02-25 | Discharge: 2023-02-25 | Disposition: A | Discharge status: Home / Self Care | Payer: Medicare HMO | Source: Ambulatory Visit | Attending: Internal Medicine | Admitting: Internal Medicine

## 2023-02-25 DIAGNOSIS — I739 Peripheral vascular disease, unspecified: Secondary | ICD-10-CM | POA: Diagnosis present

## 2023-02-25 DIAGNOSIS — Z720 Tobacco use: Secondary | ICD-10-CM | POA: Diagnosis present

## 2023-02-25 LAB — VAS US ABI WITH/WO TBI
Left ABI: 1.03
Right ABI: 1.09

## 2023-03-18 IMAGING — MR MR LUMBAR SPINE W/O CM
4 of 5 series · 27 of 48 positions shown · non-contrast
Comparison: Radiographs 04/29/2021, no prior MRI

CLINICAL DATA: Bilateral sciatica, rule out spinal stenosis

EXAM:
MRI LUMBAR SPINE WITHOUT CONTRAST
TECHNIQUE: Multiplanar, multisequence MR imaging of the lumbar spine was
performed. No intravenous contrast was administered.

[Series 3: T2 · sagittal · 4.0mm · 1.09mm/px · 6 of 15 slices shown (1 of 2)]
[im 1/15]
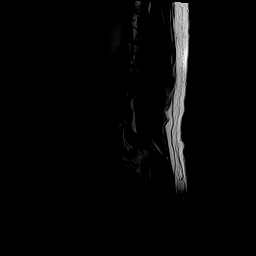
[im 3/15]
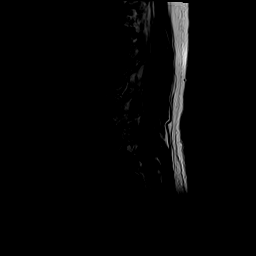
[im 6/15]
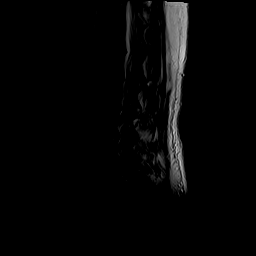
[im 9/15]
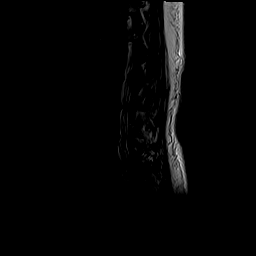
[im 12/15]
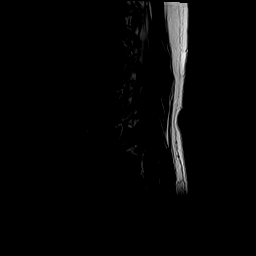
[im 15/15]
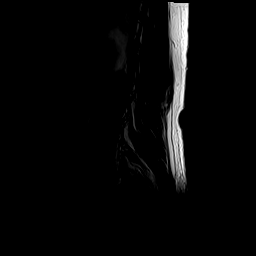

[Series 5: T1 · sagittal · 4.0mm · 1.09mm/px · 6 of 15 slices shown (1 of 2)]
[im 1/15]
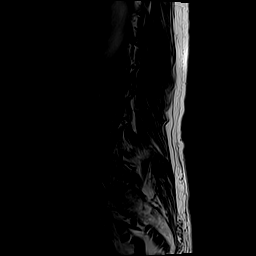
[im 3/15]
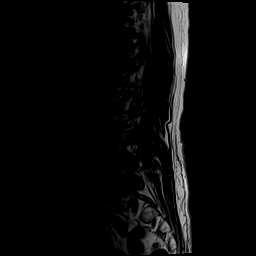
[im 6/15]
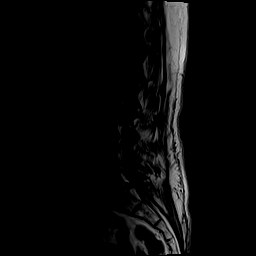
[im 9/15]
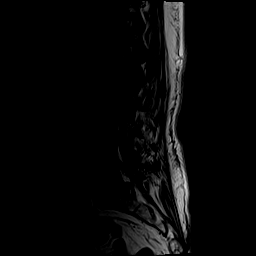
[im 12/15]
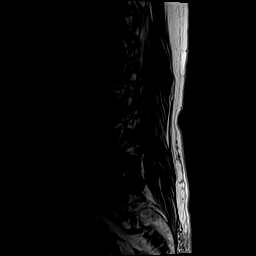
[im 15/15]
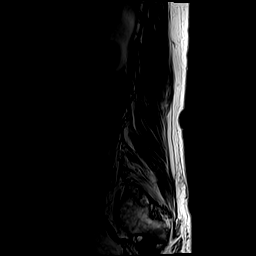

[Series 6: T2 · axial · 4.0mm · 0.39mm/px · z∈[-53,+147]mm · 9 of 40 slices shown (2 of 2)]
[im 1/40]
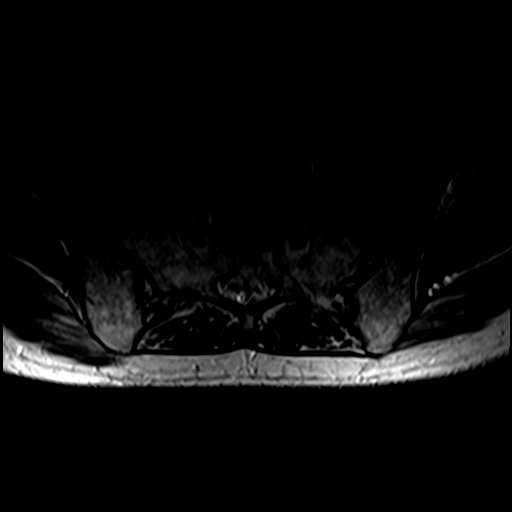
[im 6/40]
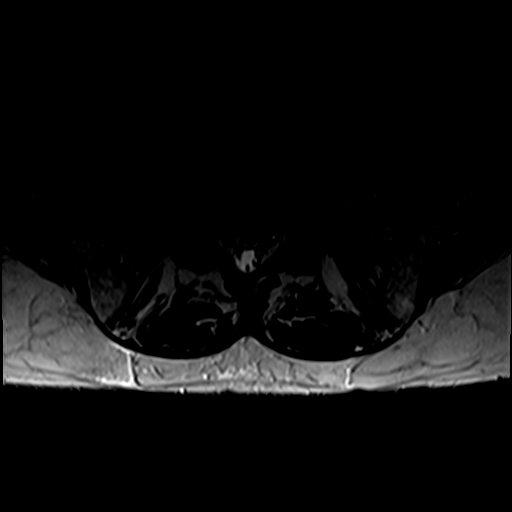
[im 12/40]
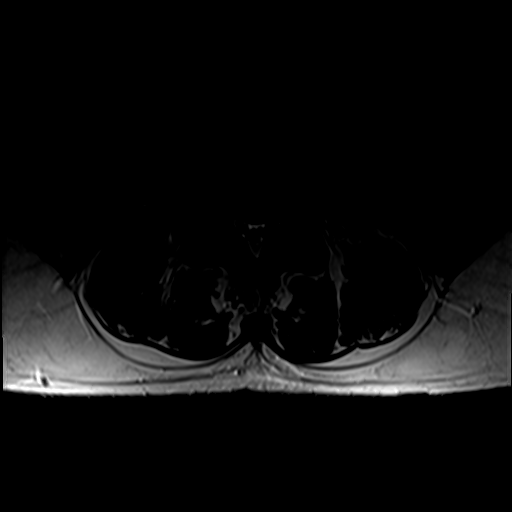
[im 17/40]
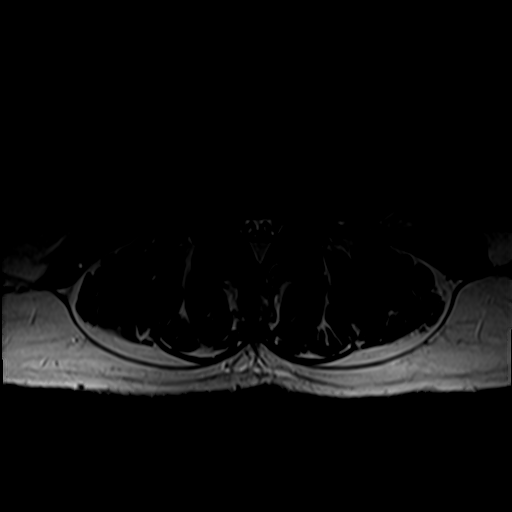
[im 20/40]
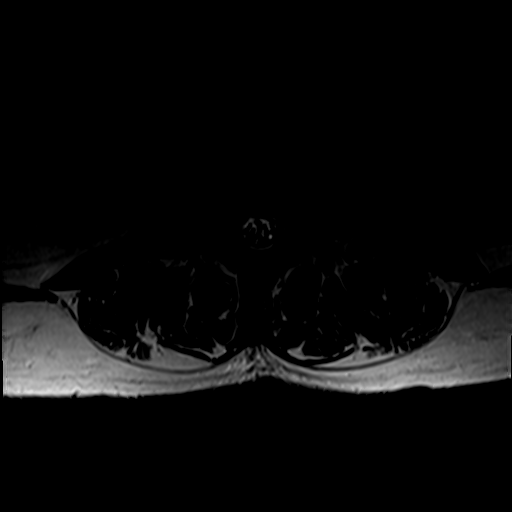
[im 23/40]
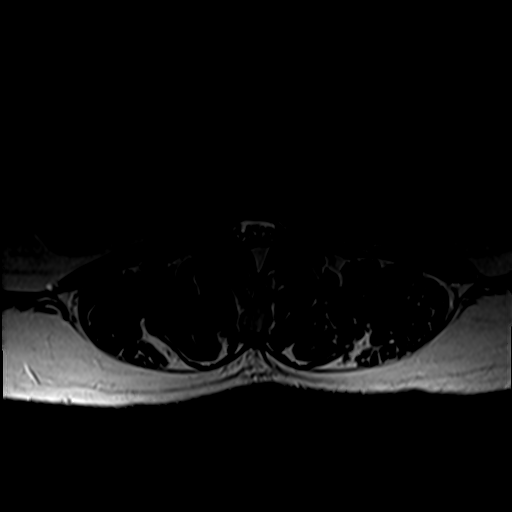
[im 28/40]
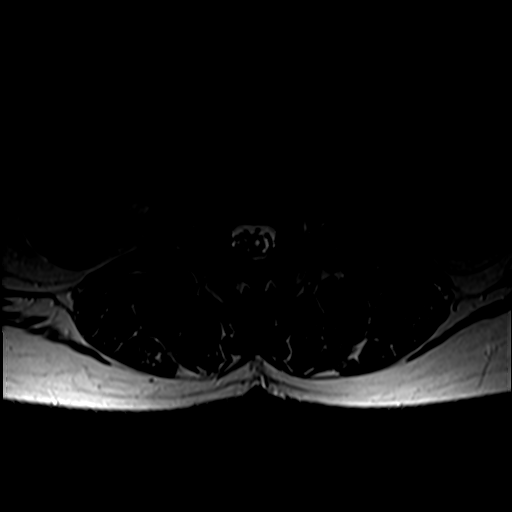
[im 34/40]
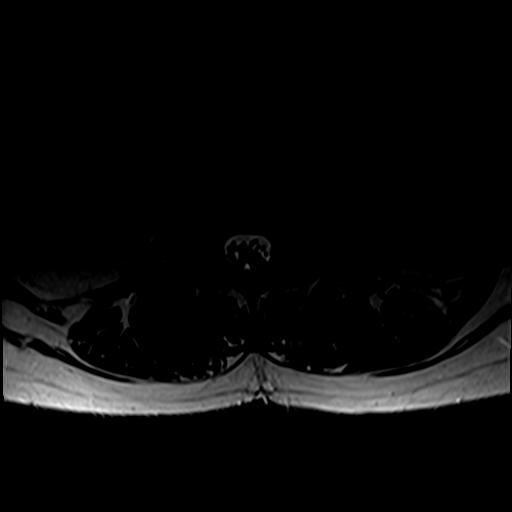
[im 40/40]
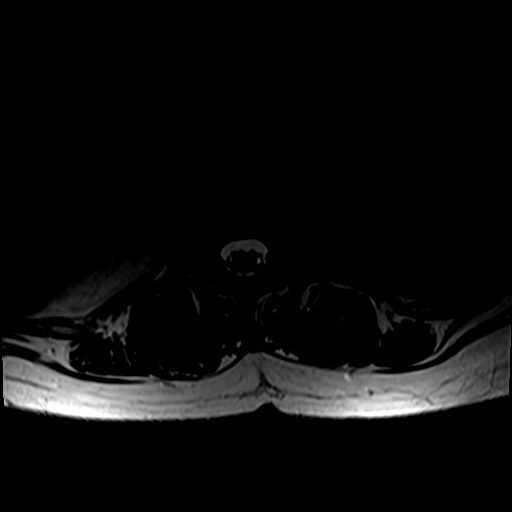

[Series 7: T1 · axial · 4.0mm · 0.39mm/px · z∈[-53,+118]mm · 6 of 40 slices shown (2 of 2)]
[im 1/40]
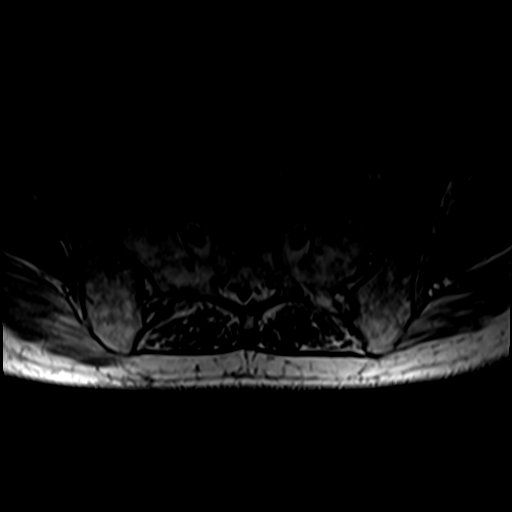
[im 6/40]
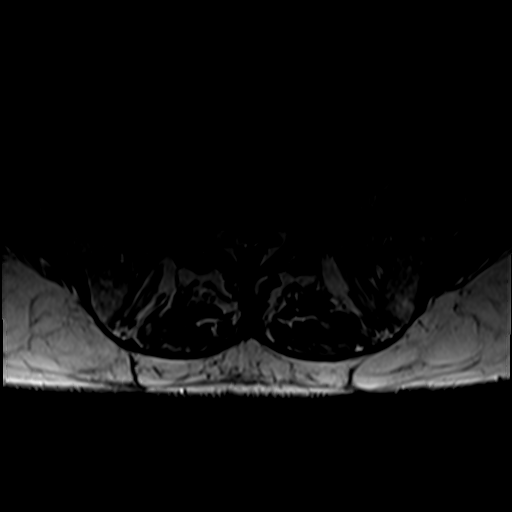
[im 12/40]
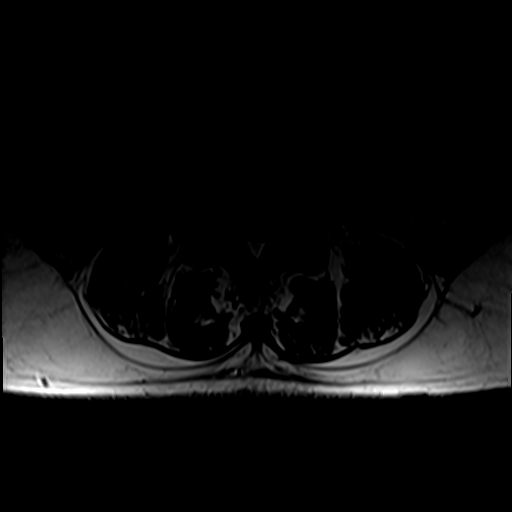
[im 17/40]
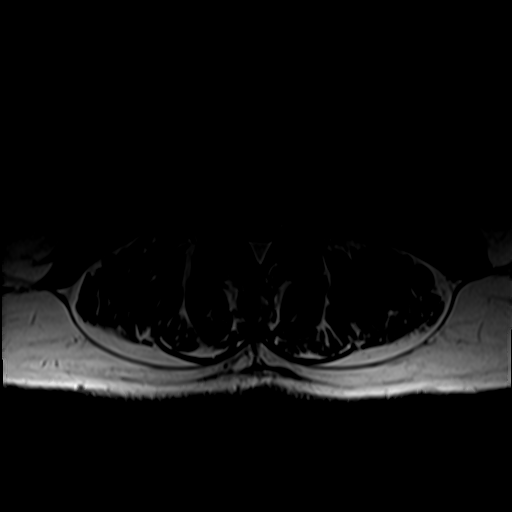
[im 20/40]
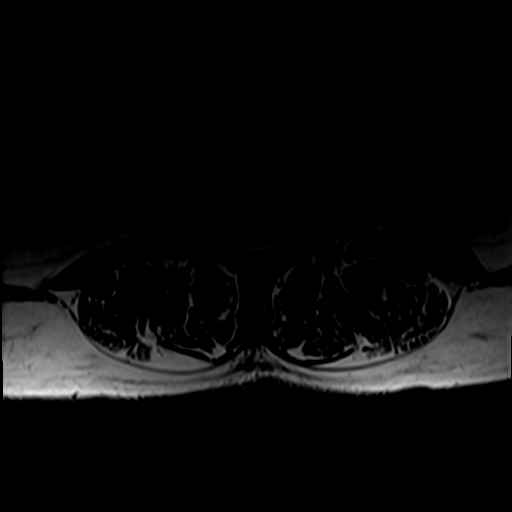
[im 34/40]
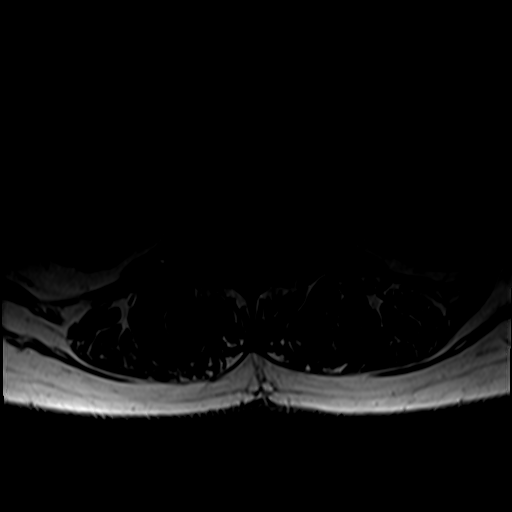

[27 of 48 positions shown; findings below may reference images not displayed]

FINDINGS: Segmentation:  Standard.

Alignment:  Physiologic.

Vertebrae: Vertebral body height loss at L1, with mild wedging; no
increased signal on STIR sequence to suggest that this is acute. No
evidence of discitis or suspicious bone lesion.

Conus medullaris and cauda equina: Conus extends to the L1-L2 level.
Conus and cauda equina appear normal.

Paraspinal and other soft tissues: Negative.

Disc levels:

T12-L1: Disc height loss with mild disc bulge. No spinal canal
stenosis or neural foraminal narrowing.

L1-L2: Disc height loss with mild disc bulge. Mild facet
arthropathy. Minimal spinal canal stenosis. No neural foraminal
narrowing.

L2-L3: Disc height loss with broad-based disc bulge. Mild facet
arthropathy. Mild spinal canal stenosis. No neural foraminal
narrowing.

L3-L4: Broad-based disc bulge. Mild facet arthropathy. Mild spinal
canal stenosis. Mild right neural foraminal narrowing.

L4-L5: Disc bulge and superimposed central protrusion. Mild facet
arthropathy. Ligamentum flavum hypertrophy. Mild spinal canal
stenosis. Moderate bilateral neural foraminal narrowing.

L5-S1: Severe disc height loss with broad-based disc bulge and
annular fissure. Mild facet arthropathy. No spinal canal stenosis.
Severe left and moderate to severe right foraminal narrowing.
IMPRESSION: 1. L5-S1 severe left and moderate to severe right neural foraminal
narrowing.
2. L4-L5 moderate bilateral neural foraminal narrowing.
3. L3-L4 and L2-L3 mild spinal canal stenosis.

## 2023-12-07 DIAGNOSIS — Z720 Tobacco use: Secondary | ICD-10-CM | POA: Diagnosis not present

## 2023-12-07 DIAGNOSIS — Z87891 Personal history of nicotine dependence: Secondary | ICD-10-CM | POA: Diagnosis not present

## 2024-04-27 ENCOUNTER — Ambulatory Visit: Admitting: Family Medicine

## 2024-06-24 NOTE — Progress Notes (Signed)
 Cardiology Office Note:   Date:  07/01/2024  ID:  Carol Lutz, DOB 10-05-1955, MRN [id number] PCP:  Teresa Thersia Jansky, MD  [facility] Providers Cardiologist:  Wendel Haws, MD Referring MD: Aletha Bene, MD  Chief Complaint/Reason for Referral: Follow-up coronary artery calcification ASSESSMENT:    1. Coronary artery calcification seen on CAT scan   2. Type 2 diabetes mellitus with complication, without long-term current use of insulin (HCC)   3. Hyperlipidemia associated with type 2 diabetes mellitus (HCC)   4. Aortic atherosclerosis   5. Hypertension associated with diabetes (HCC)   6. CKD stage 2 due to type 2 diabetes mellitus (HCC)   7. Bilateral carotid artery stenosis   8. Medication management     PLAN:   In order of problems listed above: Coronary artery calcification: Continue aspirin  81 mg, rosuvastatin  20 mg T2DM: Continue aspirin  81 mg, Jardiance 10 mg, losartan  25 mg, rosuvastatin  20 mg Hyperlipidemia: Continue rosuvastatin  20 mg; check lipid panel and LFTs today Aortic atherosclerosis: Continue aspirin  81 mg, rosuvastatin  20 mg Hypertension: Continue hydrochlorothiazide  25 mg, losartan  25 mg.  BP is well-controlled today. CKD stage II: Continue losartan  25 mg, Jardiance 10 mg Carotid artery disease: Continue aspirin  81 mg, rosuvastatin  20 mg             Dispo:  Return in about 1 year (around 07/01/2025).       I spent 35 minutes reviewing all clinical data during and prior to this visit including all relevant imaging studies, laboratories, clinical information from other health systems and prior notes from both Cardiology and other specialties, interviewing the patient, conducting a complete physical examination, and coordinating care in order to formulate a comprehensive and personalized evaluation and treatment plan.   History of Present Illness:    FOCUSED PROBLEM LIST:   Coronary artery calcification Left main and LAD coronary  calcification; lung cancer screening CT 2023 Care Everywhere T2DM Not on insulin Hyperlipidemia  LP(a) 37 Aortic atherosclerosis Abdominal ultrasound 2023 Hypertension  CKD stage II Carotid disease 1 to 39%; carotid ultrasound 2023 Peripheral vascular disease Subclavian artery calcification lung cancer CT 2023 Care Everywhere Tobacco abuse Normal ABIs 2024 No AAA abdominal ultrasound 2024 BMI 07 November 2021: Patient seen for initial consultation regarding incidentally noted coronary, subclavian, and aortic atherosclerosis with calcification.  She is doing well was asymptomatic.  Her medical therapy was continued and she was referred for carotid Dopplers and echocardiogram.   August 2023: The patient continues to do well.  She went to [place] in May and had a very uneventful time.  She occasionally gets short of breath and this is associated with wheezing.  She uses albuterol maybe once or twice a month.  She denies any exertional angina, concerning or frequent exertional dyspnea, presyncope, syncope, palpitations, paroxysmal nocturnal dyspnea, orthopnea.  She has had no signs or symptoms of stroke.  She is otherwise well and without complaints.  She has been compliant with her medical therapy.  Plan: Check lipid panel, LFTs, LP(a); obtain abdominal ultrasound and carotid ultrasound.   February 2024: In the interim the patient had an abdominal ultrasound and carotid ultrasound which were reassuring.  Her Crestor  was increased to 40 mg due to an LDL of 96.  Her LDL in February was 41.  She is doing very well.  She uses an electric bike but still feels she needs to pedal quite a bit and she does not develop any chest pain or shortness of breath.  She occasionally  gets leg fatigue and leg cramping sometimes with exertion.  She denies any need for hospitalizations or emergency room visits.  Plan: Obtain ABIs.  October 2025:  Patient consents to use of AI scribe. In the interim the patient had  ABIs which were reassuring.  She has experienced a weight loss of eight pounds, which is being monitored by her kidney doctor. Her kidney doctor plans to test her calcium  levels in February due to previously high levels.  She has a history of leg cramps, which she associates with her lower back issues following a hip fracture. No chest discomfort, lightheadedness, or blacking out spells.  She is concerned about some 'little things' on her finger, which she saw on Facebook and thought might be related to rheumatoid arthritis, although she has not been diagnosed with it. She experiences a lot of pain but is unsure of the cause.  Her cholesterol levels were last checked in April, with a total cholesterol of 160, triglycerides of 137, and LDL of 23. She is currently on rosuvastatin , taking a reduced dose of 20 mg. She is also on Ozempic at a dose of 0.25 mg, which she takes cautiously due to concerns about weight loss.     Current Medications: Current Meds  Medication Sig   aspirin  81 MG EC tablet Take 81 mg by mouth daily.   chlorhexidine  (PERIDEX ) 0.12 % solution By Mouth   cholecalciferol  (VITAMIN D ) 1000 units tablet Take 5,000 Units by mouth daily.   gabapentin (NEURONTIN) 100 MG capsule Take 100 mg by mouth 2 (two) times daily.   hydrochlorothiazide  (HYDRODIURIL ) 25 MG tablet Take 12 mg by mouth daily.   ibuprofen (ADVIL,MOTRIN) 200 MG tablet Take 400 mg by mouth every 6 (six) hours as needed for mild pain.   JARDIANCE 10 MG TABS tablet Take 10 mg by mouth daily.   losartan  (COZAAR ) 25 MG tablet Take 25 mg by mouth daily.   metFORMIN  (GLUCOPHAGE -XR) 500 MG 24 hr tablet Take 1,000 mg by mouth 2 (two) times daily.    OZEMPIC, 0.25 OR 0.5 MG/DOSE, 2 MG/1.5ML SOPN Inject into the skin once a week.   rosuvastatin  (CRESTOR ) 20 MG tablet Take 1 tablet (20 mg total) by mouth daily.   tretinoin (RETIN-A) 0.05 % cream sparingly Topical Every Night PRN   zolpidem  (AMBIEN ) 10 MG tablet Take 10 mg by  mouth at bedtime as needed for sleep.   [DISCONTINUED] rosuvastatin  (CRESTOR ) 40 MG tablet Take 1 tablet (40 mg total) by mouth daily.     Review of Systems:   Please see the history of present illness.    All other systems reviewed and are negative.     EKGs/Labs/Other Test Reviewed:   EKG:    EKG Interpretation Date/Time:  Friday July 01 2024 10:21:04 EDT Ventricular Rate:  58 PR Interval:  170 QRS Duration:  76 QT Interval:  426 QTC Calculation: 418 R Axis:   62  Text Interpretation: Sinus bradycardia Low voltage QRS Cannot rule out Anteroseptal infarct , age undetermined No previous ECGs available Confirmed by Wendel Haws ([id number]) on 07/01/2024 10:27:16 AM        CARDIAC STUDIES: Refer to CV Procedures and Imaging Tabs   Risk Assessment/Calculations:          Physical Exam:   VS:  BP (!) 104/54   Pulse (!) 58   Ht 5' 3.5 (1.613 m)   Wt 119 lb (54 kg)   SpO2 99%   BMI 20.75 kg/m  Wt Readings from Last 3 Encounters:  07/01/24 119 lb (54 kg)  02/03/23 132 lb 3.2 oz (60 kg)  05/09/22 125 lb 3.2 oz (56.8 kg)      GENERAL:  No apparent distress, AOx3 HEENT:  No carotid bruits, +2 carotid impulses, no scleral icterus CAR: RRR no murmurs, gallops, rubs, or thrills RES:  Clear to auscultation bilaterally ABD:  Soft, nontender, nondistended, positive bowel sounds x 4 VASC:  +2 radial pulses, +2 carotid pulses NEURO:  CN 2-12 grossly intact; motor and sensory grossly intact PSYCH:  No active depression or anxiety EXT:  No edema, ecchymosis, or cyanosis  Signed, Monna Crean K Camilah Spillman, MD  07/01/2024 10:39 AM    [facility] [address] [place], [place], [place]  [address] Phone: [phone]; Fax: [phone]   Note:  This document was prepared using Dragon voice recognition software and may include unintentional dictation errors.

## 2024-07-01 ENCOUNTER — Ambulatory Visit: Attending: Internal Medicine | Admitting: Internal Medicine

## 2024-07-01 ENCOUNTER — Encounter: Payer: Self-pay | Admitting: Internal Medicine

## 2024-07-01 VITALS — BP 104/54 | HR 58 | Ht 63.5 in | Wt 119.0 lb

## 2024-07-01 DIAGNOSIS — E1169 Type 2 diabetes mellitus with other specified complication: Secondary | ICD-10-CM

## 2024-07-01 DIAGNOSIS — I152 Hypertension secondary to endocrine disorders: Secondary | ICD-10-CM

## 2024-07-01 DIAGNOSIS — E118 Type 2 diabetes mellitus with unspecified complications: Secondary | ICD-10-CM

## 2024-07-01 DIAGNOSIS — I6523 Occlusion and stenosis of bilateral carotid arteries: Secondary | ICD-10-CM

## 2024-07-01 DIAGNOSIS — E1159 Type 2 diabetes mellitus with other circulatory complications: Secondary | ICD-10-CM

## 2024-07-01 DIAGNOSIS — I2510 Atherosclerotic heart disease of native coronary artery without angina pectoris: Secondary | ICD-10-CM | POA: Diagnosis not present

## 2024-07-01 DIAGNOSIS — N182 Chronic kidney disease, stage 2 (mild): Secondary | ICD-10-CM

## 2024-07-01 DIAGNOSIS — E1122 Type 2 diabetes mellitus with diabetic chronic kidney disease: Secondary | ICD-10-CM

## 2024-07-01 DIAGNOSIS — E785 Hyperlipidemia, unspecified: Secondary | ICD-10-CM

## 2024-07-01 DIAGNOSIS — I700 Atherosclerosis of aorta: Secondary | ICD-10-CM

## 2024-07-01 DIAGNOSIS — Z79899 Other long term (current) drug therapy: Secondary | ICD-10-CM

## 2024-07-01 NOTE — Patient Instructions (Signed)
 Medication Instructions:  Your physician recommends that you continue on your current medications as directed. Please refer to the Current Medication list given to you today.  *If you need a refill on your cardiac medications before your next appointment, please call your pharmacy*  Lab Work: Please complete a FASTING lipid panel and an LFT at any [facility]. There is a [facility] on the first floor of our building.  If you have labs (blood work) drawn today and your tests are completely normal, you will receive your results only by: MyChart Message (if you have MyChart) OR A paper copy in the mail If you have any lab test that is abnormal or we need to change your treatment, we will call you to review the results.  Testing/Procedures: None.  Follow-Up: At [facility], you and your health needs are our priority.  As part of our continuing mission to provide you with exceptional heart care, our providers are all part of one team.  This team includes your primary Cardiologist (physician) and Advanced Practice Providers or APPs (Physician Assistants and Nurse Practitioners) who all work together to provide you with the care you need, when you need it.  Your next appointment:   1 year(s)  Provider:   Glendia Ferrier, PA-C

## 2024-07-02 ENCOUNTER — Ambulatory Visit: Payer: Self-pay | Admitting: Internal Medicine

## 2024-07-02 LAB — HEPATIC FUNCTION PANEL
ALT: 16 IU/L (ref 0–32)
AST: 25 IU/L (ref 0–40)
Albumin: 4.4 g/dL (ref 3.9–4.9)
Alkaline Phosphatase: 92 IU/L (ref 49–135)
Bilirubin Total: 0.9 mg/dL (ref 0.0–1.2)
Bilirubin, Direct: 0.3 mg/dL (ref 0.00–0.40)
Total Protein: 7.1 g/dL (ref 6.0–8.5)

## 2024-07-02 LAB — LIPID PANEL
Chol/HDL Ratio: 2.3 ratio (ref 0.0–4.4)
Cholesterol, Total: 127 mg/dL (ref 100–199)
HDL: 56 mg/dL (ref >39)
LDL Chol Calc (NIH): 55 mg/dL (ref 0–99)
Triglycerides: 83 mg/dL (ref 0–149)
VLDL Cholesterol Cal: 16 mg/dL (ref 5–40)
